# Patient Record
Sex: Female | Born: 2000
Health system: Southern US, Community
[De-identification: ages and names within clinical notes are randomized; demographics above are authoritative.]

## PROBLEM LIST (undated history)

## (undated) DIAGNOSIS — Z87440 Personal history of urinary (tract) infections: Secondary | ICD-10-CM

## (undated) DIAGNOSIS — Z9889 Other specified postprocedural states: Secondary | ICD-10-CM

## (undated) DIAGNOSIS — N3090 Cystitis, unspecified without hematuria: Secondary | ICD-10-CM

## (undated) DIAGNOSIS — F32A Depression, unspecified: Secondary | ICD-10-CM

## (undated) DIAGNOSIS — F419 Anxiety disorder, unspecified: Secondary | ICD-10-CM

## (undated) DIAGNOSIS — E559 Vitamin D deficiency, unspecified: Secondary | ICD-10-CM

## (undated) DIAGNOSIS — F329 Major depressive disorder, single episode, unspecified: Secondary | ICD-10-CM

## (undated) DIAGNOSIS — R112 Nausea with vomiting, unspecified: Secondary | ICD-10-CM

## (undated) DIAGNOSIS — K602 Anal fissure, unspecified: Secondary | ICD-10-CM

## (undated) HISTORY — DX: Anxiety disorder, unspecified: F41.9

## (undated) HISTORY — DX: Vitamin D deficiency, unspecified: E55.9

## (undated) HISTORY — PX: COLONOSCOPY: SHX174

## (undated) HISTORY — DX: Depression, unspecified: F32.A

## (undated) HISTORY — DX: Anal fissure, unspecified: K60.2

## (undated) HISTORY — PX: WISDOM TOOTH EXTRACTION: SHX21

## (undated) HISTORY — DX: Personal history of urinary (tract) infections: Z87.440

---
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
## 2019-01-01 ENCOUNTER — Encounter: Payer: Self-pay | Admitting: Physician Assistant

## 2019-01-11 ENCOUNTER — Ambulatory Visit (INDEPENDENT_AMBULATORY_CARE_PROVIDER_SITE_OTHER): Payer: 59 | Admitting: Physician Assistant

## 2019-01-11 ENCOUNTER — Encounter: Payer: Self-pay | Admitting: Physician Assistant

## 2019-01-11 VITALS — BP 92/62 | HR 90 | Ht 63.0 in | Wt 147.0 lb

## 2019-01-11 DIAGNOSIS — K625 Hemorrhage of anus and rectum: Secondary | ICD-10-CM | POA: Diagnosis not present

## 2019-01-11 DIAGNOSIS — K602 Anal fissure, unspecified: Secondary | ICD-10-CM

## 2019-01-11 MED ORDER — AMBULATORY NON FORMULARY MEDICATION: 1 refills | Status: DC

## 2019-01-11 MED ORDER — LIDOCAINE HCL 2 % EX CREA
TOPICAL_CREAM | CUTANEOUS | 1 refills | Status: DC
Start: 2019-01-11 — End: 2019-07-14

## 2019-01-11 NOTE — Patient Instructions (Signed)
We have sent a prescription for nitroglycerin 0.125% gel to [facility]. You should apply a pea size amount to your rectum three times daily x 6-8 weeks.  [facility]'s information is below: Address: [address] [place], [place] [address]  [phone]  *Please DO NOT go directly from our office to pick up this medication! Give the pharmacy 1 day to process the prescription as this is compounded at takes time to make.  Use a stool softener daily 1-2 tablets of colace or dulcolax Keep your stools regular and soft. Call if no relief in 6-8 weeks.   Do sitz baths 2-3 times a day  How to Take a [employer] A sitz bath is a warm water bath that may be used to care for your rectum, genital area, or the area between your rectum and genitals (perineum). For a sitz bath, the water only comes up to your hips and covers your buttocks. A sitz bath may done at home in a bathtub or with a portable sitz bath that fits over the toilet. Your health care provider may recommend a sitz bath to help:  Relieve pain and discomfort after delivering a baby.  Relieve pain and itching from hemorrhoids or anal fissures.  Relieve pain after certain surgeries.  Relax muscles that are sore or tight. How to take a sitz bath Take 3-4 sitz baths a day, or as many as told by your health care provider. Bathtub sitz bath To take a sitz bath in a bathtub: 1. Partially fill a bathtub with warm water. The water should be deep enough to cover your hips and buttocks when you are sitting in the tub. 2. If your health care provider told you to put medicine in the water, follow his or her instructions. 3. Sit in the water. 4. Open the tub drain a little, and leave it open during your bath. 5. Turn on the warm water again, enough to replace the water that is draining out. Keep the water running throughout your bath. This helps keep the water at the right level and the right temperature. 6. Soak in the  water for 15-20 minutes, or as long as told by your health care provider. 7. When you are done, be careful when you stand up. You may feel dizzy. 8. After the sitz bath, pat yourself dry. Do not rub your skin to dry it.  Over-the-toilet sitz bath To take a sitz bath with an over-the-toilet basin: 1. Follow the manufacturer's instructions. 2. Fill the basin with warm water. 3. If your health care provider told you to put medicine in the water, follow his or her instructions. 4. Sit on the seat. Make sure the water covers your buttocks and perineum. 5. Soak in the water for 15-20 minutes, or as long as told by your health care provider. 6. After the sitz bath, pat yourself dry. Do not rub your skin to dry it. 7. Clean and dry the basin between uses. 8. Discard the basin if it cracks, or according to the manufacturer's instructions. Contact a health care provider if:  Your symptoms get worse. Do not continue with sitz baths if your symptoms get worse.  You have new symptoms. If this happens, do not continue with sitz baths until you talk with your health care provider. Summary  A sitz bath is a warm water bath in which the water only comes up to your hips and covers your buttocks.  A sitz bath may help  relieve itching, relieve pain, and relax muscles that are sore or tight in the lower part of your body, including your genital area.  Take 3-4 sitz baths a day, or as many as told by your health care provider. Soak in the water for 15-20 minutes.  Do not continue with sitz baths if your symptoms get worse. This information is not intended to replace advice given to you by your health care provider. Make sure you discuss any questions you have with your health care provider. Document Released: 01/09/2004 Document Revised: 04/20/2017 Document Reviewed: 04/20/2017 Elsevier Patient Education  2020 [employer].

## 2019-01-11 NOTE — Progress Notes (Signed)
Chief Complaint: Rectal bleeding  HPI:    [name] is a 18 year old Caucasian female with a past medical history as listed below including anxiety, who was referred to me by Sharion Balloon, FNP for a complaint of rectal bleeding.     Today, the patient explains that about 2 months ago she got severely constipated while at college and would not have a bowel movement but every 3 to 4 days and it was very hard and she had to strain for the stools.  She started noticing some streaking blood in her stool and that she had a sharp pain with passing a bowel movement.  This pain continued with bowel movements and the bleeding has gotten slightly more severe.  Over the past 3 to 4 weeks she has been able to maintain daily if not twice a day stools but they are still sometimes hard with pain and blood.  She tried using a stool softener which seemed to help but she continued with the pain so she discontinued this.    Denies fever, chills, weight loss, family history of colon cancer or IBD.     Past Medical History:  Diagnosis Date  . Cystitis     No past surgical history on file.  Current Outpatient Medications  Medication Sig Dispense Refill  . ALPRAZolam (XANAX) 0.5 MG tablet Take 1 tablet (0.5 mg total) by mouth 3 (three) times daily as needed for anxiety (Panic). 90 tablet 2  . escitalopram (LEXAPRO) 20 MG tablet Take 1 tablet (20 mg total) by mouth at bedtime. 90 tablet 1  . medroxyPROGESTERone (DEPO-PROVERA) 150 MG/ML injection Depo-Provera 150 mg/mL intramuscular suspension  Inject 1 mL every 3 months by intramuscular route.     No current facility-administered medications for this visit.     Allergies as of 01/11/2019 - Review Complete 12/18/2018  Allergen Reaction Noted  . Shellfish allergy  05/08/2018   Family History: No significant family hx  Social History   Socioeconomic History  . Marital status: Single    Spouse name: Not on file  . Number of children: Not on file  .  Years of education: Not on file  . Highest education level: Not on file  Occupational History  . Not on file  Social Needs  . Financial resource strain: Not on file  . Food insecurity    Worry: Not on file    Inability: Not on file  . Transportation needs    Medical: Not on file    Non-medical: Not on file  Tobacco Use  . Smoking status: Never Smoker  . Smokeless tobacco: Never Used  Substance and Sexual Activity  . Alcohol use: No  . Drug use: No  . Sexual activity: Never    Birth control/protection: None  Lifestyle  . Physical activity    Days per week: Not on file    Minutes per session: Not on file  . Stress: Not on file  Relationships  . Social Herbalist on phone: Not on file    Gets together: Not on file    Attends religious service: Not on file    Active member of club or organization: Not on file    Attends meetings of clubs or organizations: Not on file    Relationship status: Not on file  . Intimate partner violence    Fear of current or ex partner: Not on file    Emotionally abused: Not on file    Physically abused:  Not on file    Forced sexual activity: Not on file  Other Topics Concern  . Not on file  Social History Narrative  . Not on file    Review of Systems:    Constitutional: No weight loss, fever or chills Skin: No rash  Cardiovascular: No chest pain  Respiratory: No SOB Gastrointestinal: See HPI and otherwise negative Genitourinary: No dysuria  Neurological: No headache, dizziness or syncope Musculoskeletal: No new muscle or joint pain Hematologic: No bruising Psychiatric: +anxiety   Physical Exam:  Vital signs: BP 92/62   Pulse 90   Ht 5\' 3"  (1.6 m)   Wt 147 lb (66.7 kg)   BMI 26.04 kg/m   Constitutional:   Pleasant Caucasian female appears to be in NAD, Well developed, Well nourished, alert and cooperative Head:  Normocephalic and atraumatic. Eyes:   PEERL, EOMI. No icterus. Conjunctiva pink. Ears:  Normal auditory  acuity. Neck:  Supple Throat: Oral cavity and pharynx without inflammation, swelling or lesion.  Respiratory: Respirations even and unlabored. Lungs clear to auscultation bilaterally.   No wheezes, crackles, or rhonchi.  Cardiovascular: Normal S1, S2. No MRG. Regular rate and rhythm. No peripheral edema, cyanosis or pallor.  Gastrointestinal:  Soft, nondistended, nontender. No rebound or guarding. Normal bowel sounds. No appreciable masses or hepatomegaly. Rectal:  External: Posterior fissure, ttp; Internal exam: deferred due to pain  Msk:  Symmetrical without gross deformities. Without edema, no deformity or joint abnormality.  Neurologic:  Alert and  oriented x4;  grossly normal neurologically.  Skin:   Dry and intact without significant lesions or rashes. Psychiatric: Demonstrates good judgement and reason without abnormal affect or behaviors.  No recent labs or imaging.  Assessment: 1.  Anal fissure: Posterior and tender at time of exam today, likely source of pain and bleeding from constipation 2.  Rectal bleeding: Due to above  Plan: 1.  Prescribed Nitroglycerin ointment to be applied 3 times daily x6 to 8 weeks. 2.  Also prescribed Lidocaine ointment to be applied as needed for pain. 3.  Discussed sitz bath's 2-3 times a day for 15 to 20 minutes. 4.  Advised the patient to start a daily stool softener, 1-2 tabs of Colace or Dulcolax daily. 5.  Discussed that she should maintain soft solid daily stools and avoid straining and constipation. 6.  Patient to follow in clinic with [place] as needed in the future.  Did discuss that if her pain and bleeding is not completely resolved within 2 months that she needs to call our clinic.  Patient was assigned to Dr. Ardis Hughs today.  Ellouise Newer, PA-C [facility] 01/11/2019, 10:02 AM  Cc: Sharion Balloon, FNP

## 2019-01-11 NOTE — Progress Notes (Signed)
I agree with the above note, plan 

## 2019-03-18 MED FILL — ALPRAZolam 0.5 MG TABS: 0.5 | 30 days supply | Qty: 90 | Fill #1

## 2019-03-20 ENCOUNTER — Other Ambulatory Visit: Payer: Self-pay

## 2019-03-20 ENCOUNTER — Encounter: Payer: Self-pay | Admitting: Physician Assistant

## 2019-03-20 ENCOUNTER — Ambulatory Visit (INDEPENDENT_AMBULATORY_CARE_PROVIDER_SITE_OTHER): Payer: 59 | Admitting: Physician Assistant

## 2019-03-20 VITALS — BP 102/58 | HR 83 | Temp 98.0°F | Ht 63.0 in | Wt 150.3 lb

## 2019-03-20 DIAGNOSIS — K602 Anal fissure, unspecified: Secondary | ICD-10-CM | POA: Diagnosis not present

## 2019-03-20 DIAGNOSIS — K625 Hemorrhage of anus and rectum: Secondary | ICD-10-CM | POA: Diagnosis not present

## 2019-03-20 MED ORDER — DILTIAZEM GEL 2 %: 1.0000 "application " | Freq: Three times a day (TID) | CUTANEOUS | 3 refills | Status: DC

## 2019-03-20 NOTE — Patient Instructions (Addendum)
If you are age 19 or older, your body mass index should be between 23-30. Your Body mass index is 26.62 kg/m. If this is out of the aforementioned range listed, please consider follow up with your Primary Care Provider.  If you are age 51 or younger, your body mass index should be between 19-25. Your Body mass index is 26.62 kg/m. If this is out of the aformentioned range listed, please consider follow up with your Primary Care Provider.   We have sent the following medications to [facility] for you to pick up at your convenience:  1. Diltiazem 2% gel three times daily.   2. Continue Lidocaine as needed.  Follow up in 4-6 weeks.

## 2019-03-20 NOTE — Progress Notes (Signed)
I agree with the above note, plan 

## 2019-03-20 NOTE — Progress Notes (Signed)
Chief Complaint: Follow-up rectal bleeding  HPI:    Krystal Peters is a 18 year old Caucasian female with a past medical history as listed below including anxiety, signed to Dr. Ardis Hughs at last visit, who returns to clinic today for follow-up of her rectal bleeding.     01/11/2019 patient seen in clinic and described being constipated and straining for stools and noticing some streaking in her stool.  And had gotten worse.  Still continues with some constipation at that time.  At time of exam patient was found to have a posterior tender anal fissure and was prescribed nitroglycerin ointment 3 times daily x60 weeks.  Also Lidocaine ointment.  She was to start a stool softener 1-2 tabs of Colace or Dulcolax daily.  It was explained that if her pain and bleeding was not completely resolved within 2 months that she would need to follow in clinic.    Today, the patient presents to clinic and explains that she started out using the Nitroglycerin ointment 3 times daily but had a bad headache and got dizzy from using this so decreased the amount and used it only twice a day, tells me that she has had a decreased amount of bleeding but still has a small amount of bright red blood on the toilet paper when wiping after almost every bowel movement and continues with some lingering rectal pain.  Has also been using lidocaine ointment to help with this.  Tells me that she thinks it has gotten maybe slightly better.  She is able to have regular bowel movements using a stool softener and tells me they are not hard or dry and able to pass with no straining.    Tells me this semester is winding down which is helping with her anxiety level.    Denies fever, chills, weight loss, abdominal pain or symptoms that awaken her from sleep.  Past Medical History:  Diagnosis Date  . Anxiety   . Cystitis   . Depression   . History of UTI     No past surgical history on file.  Current Outpatient Medications  Medication Sig  Dispense Refill  . ALPRAZolam (XANAX) 0.5 MG tablet Take 1 tablet (0.5 mg total) by mouth 3 (three) times daily as needed for anxiety (Panic). 90 tablet 2  . AMBULATORY NON FORMULARY MEDICATION Nitroglycerin ointment 0.125% use 3 times a day for 6-8 weeks, use a pea size amount and insert rectally 1 Tube 1  . escitalopram (LEXAPRO) 20 MG tablet Take 1 tablet (20 mg total) by mouth at bedtime. 90 tablet 1  . Lidocaine HCl 2 % CREA Apply rectally as needed for rectal pain 118 mL 1  . medroxyPROGESTERone (DEPO-PROVERA) 150 MG/ML injection Depo-Provera 150 mg/mL intramuscular suspension  Inject 1 mL every 3 months by intramuscular route.     No current facility-administered medications for this visit.     Allergies as of 03/20/2019 - Review Complete 01/11/2019  Allergen Reaction Noted  . Shellfish allergy  05/08/2018    Family History  Problem Relation Age of Onset  . Thyroid disease Maternal Grandmother   . Heart disease Maternal Grandfather   . Stroke Maternal Grandfather   . Heart disease Paternal Grandmother     Social History   Socioeconomic History  . Marital status: Single    Spouse name: Not on file  . Number of children: Not on file  . Years of education: Not on file  . Highest education level: Not on file  Occupational History  .  Not on file  Social Needs  . Financial resource strain: Not on file  . Food insecurity    Worry: Not on file    Inability: Not on file  . Transportation needs    Medical: Not on file    Non-medical: Not on file  Tobacco Use  . Smoking status: Never Smoker  . Smokeless tobacco: Never Used  Substance and Sexual Activity  . Alcohol use: No  . Drug use: No  . Sexual activity: Never    Birth control/protection: None  Lifestyle  . Physical activity    Days per week: Not on file    Minutes per session: Not on file  . Stress: Not on file  Relationships  . Social Herbalist on phone: Not on file    Gets together: Not on file     Attends religious service: Not on file    Active member of club or organization: Not on file    Attends meetings of clubs or organizations: Not on file    Relationship status: Not on file  . Intimate partner violence    Fear of current or ex partner: Not on file    Emotionally abused: Not on file    Physically abused: Not on file    Forced sexual activity: Not on file  Other Topics Concern  . Not on file  Social History Narrative  . Not on file    Review of Systems:    Constitutional: No weight loss, fever or chills Cardiovascular: No chest pain  Respiratory: No SOB  Gastrointestinal: See HPI and otherwise negative   Physical Exam:  Vital signs: BP (!) 102/58   Pulse 83   Temp 98 F (36.7 C)   Ht 5\' 3"  (1.6 m)   Wt 150 lb 4.8 oz (68.2 kg)   SpO2 98%   BMI 26.62 kg/m   Constitutional:   Pleasant Caucasian female appears to be in NAD, Well developed, Well nourished, alert and cooperative Respiratory: Respirations even and unlabored. Lungs clear to auscultation bilaterally.   No wheezes, crackles, or rhonchi.  Cardiovascular: Normal S1, S2. No MRG. Regular rate and rhythm. No peripheral edema, cyanosis or pallor.  Gastrointestinal:  Soft, nondistended, nontender. No rebound or guarding. Normal bowel sounds. No appreciable masses or hepatomegaly. Rectal: External: Healing posterior fissure, still very tender to palpation, increased sphincter tone Psychiatric:  Demonstrates good judgement and reason without abnormal affect or behaviors.  No recent labs or imaging.  Assessment: 1.  Anal fissure: This is starting to heal is observed at time of exam today, still very tender for the patient and still with a small amount of bright red blood with bowel movements, patient had only been using nitro twice a day due to some dizziness and headaches  Plan: 1.  Stop Nitroglycerin.  Prescribed Diltiazem 2% ointment to be applied 3 times a day for the next 4 to 6 weeks. 2.  Continue  stool softener to maintain soft easy to pass stools. 3.  Continue lidocaine as needed 4.  Discussed with patient that if this still continues not to heal will need to consider surgical referral.  Hopefully it will get better with a change in ointment. 5.  Patient to follow in clinic in 4 to 6 weeks with me or sooner if necessary.  Ellouise Newer, PA-C [facility] 03/20/2019, 8:39 AM  Cc: Sharion Balloon, FNP

## 2019-04-09 ENCOUNTER — Encounter: Payer: Self-pay | Admitting: Family

## 2019-04-09 ENCOUNTER — Ambulatory Visit (INDEPENDENT_AMBULATORY_CARE_PROVIDER_SITE_OTHER): Payer: 59 | Admitting: Family

## 2019-04-09 DIAGNOSIS — B354 Tinea corporis: Secondary | ICD-10-CM | POA: Diagnosis not present

## 2019-04-09 MED ORDER — KETOCONAZOLE 2 % EX CREA: 1.0000 "application " | TOPICAL_CREAM | Freq: Every day | CUTANEOUS | 1 refills | Status: DC

## 2019-04-09 NOTE — Progress Notes (Signed)
   Virtual Visit via telephone Note Due to COVID-19 pandemic this visit was conducted virtually. This visit type was conducted due to national recommendations for restrictions regarding the COVID-19 Pandemic (e.g. social distancing, sheltering in place) in an effort to limit this patient's exposure and mitigate transmission in our community. All issues noted in this document were discussed and addressed.  A physical exam was not performed with this format.  I connected with Krystal Peters on 04/09/19 at 11:33 AM by telephone and verified that I am speaking with the correct person using two identifiers. Krystal Peters is currently located at home and no one is currently with her during visit. The provider, Evelina Dun, FNP is located in their office at time of visit.  I discussed the limitations, risks, security and privacy concerns of performing an evaluation and management service by telephone and the availability of in person appointments. I also discussed with the patient that there may be a patient responsible charge related to this service. The patient expressed understanding and agreed to proceed.   History and Present Illness:  Patient calls the office today with rash. She reports her roommates boyfriend is wrestler and was diagnosed with ringworm 4 weeks ago and passed it to her roommate who went to her PCPwas given lotriminand her rash improved. The patient reports she noticed the circular rash about two weeks ago. She tried to using the lotrimin with no relief and has spread to her arms, buttocks, and legs.  She reports the area is pruritic.  Rash This is a new problem. The current episode started 1 to 4 weeks ago. The problem has been waxing and waning since onset. The affected locations include the chest.      Review of Systems  Skin: Positive for rash.  All other systems reviewed and are negative.    Observations/Objective: No SOB or distress noted  Assessment and Plan:  1. Tinea corporis Good hand hygiene discussed Do not scratch Wash bedding in hot water Call if area is worsening or not improving  - ketoconazole (NIZORAL) 2 % cream; Apply 1 application topically daily.  Dispense: 60 g; Refill: 1    I discussed the assessment and treatment plan with the patient. The patient was provided an opportunity to ask questions and all were answered. The patient agreed with the plan and demonstrated an understanding of the instructions.   The patient was advised to call back or seek an in-person evaluation if the symptoms worsen or if the condition fails to improve as anticipated.  The above assessment and management plan was discussed with the patient. The patient verbalized understanding of and has agreed to the management plan. Patient is aware to call the clinic if symptoms persist or worsen. Patient is aware when to return to the clinic for a follow-up visit. Patient educated on when it is appropriate to go to the emergency department.   Time call ended:  11:43 AM  I provided 10 minutes of non-face-to-face time during this encounter.    Evelina Dun, FNP

## 2019-04-24 ENCOUNTER — Ambulatory Visit: Payer: 59 | Admitting: Physician Assistant

## 2019-05-16 ENCOUNTER — Ambulatory Visit: Payer: 59 | Admitting: Physician Assistant

## 2019-05-28 MED FILL — ALPRAZolam 0.5 MG TABS: 0.5 | 30 days supply | Qty: 90 | Fill #2

## 2019-06-20 ENCOUNTER — Ambulatory Visit: Payer: 59 | Admitting: Psychiatry

## 2019-06-24 ENCOUNTER — Encounter: Payer: Self-pay | Admitting: Psychiatry

## 2019-06-24 ENCOUNTER — Ambulatory Visit (INDEPENDENT_AMBULATORY_CARE_PROVIDER_SITE_OTHER): Payer: 59 | Admitting: Psychiatry

## 2019-06-24 VITALS — Ht 63.0 in | Wt 150.0 lb

## 2019-06-24 DIAGNOSIS — F325 Major depressive disorder, single episode, in full remission: Secondary | ICD-10-CM | POA: Diagnosis not present

## 2019-06-24 DIAGNOSIS — F410 Panic disorder [episodic paroxysmal anxiety] without agoraphobia: Secondary | ICD-10-CM | POA: Diagnosis not present

## 2019-06-24 DIAGNOSIS — F411 Generalized anxiety disorder: Secondary | ICD-10-CM | POA: Diagnosis not present

## 2019-06-24 MED ORDER — ALPRAZOLAM 0.5 MG PO TABS: 0.5000 mg | ORAL_TABLET | Freq: Three times a day (TID) | ORAL | 2 refills | Status: DC | PRN

## 2019-06-24 MED ORDER — ESCITALOPRAM OXALATE 20 MG PO TABS: 20.0000 mg | ORAL_TABLET | Freq: Every day | ORAL | 1 refills | Status: DC

## 2019-06-24 MED FILL — ESCITALOPRAM 20 MG TABLET: 20 | 90 days supply | Qty: 90 | Fill #0

## 2019-06-24 NOTE — Progress Notes (Signed)
[facility]  Patient ID: Krystal Peters,  MRN: [id number]  PCP: Patient, No Pcp Per  Date of Evaluation: 06/24/2019 Time spent:20 minutes from [id number] to 1600  Chief Complaint:  Chief Complaint    Panic Attack; Anxiety; Depression      HISTORY/CURRENT STATUS: Krystal Peters is provided telemedicine audiovisual appointment session, declining videocamera due to panic and social anxiety canceling her onsite appointment for cough and headache starting new job babysitting and having third molars excised 06/20/2018, phone to phone 20 minutes with consent with epic collateral individually for psychiatric interview and exam in 35-month evaluation and management of panic, generalized anxiety, and major depression.  She estimates today that she is not depressed though she has 1 or 2-day episodes in the interim usually commensurate with situational stressors.  Her panic attacks vary none being severe but requiring Xanax usually a couple per week on the average but lately taking 1 or 2 daily for the last 2 weeks with all of her current stress to try to keep functioning and to decompress at the end of the day.  She completed therapy with Agustina Caroli, PhD and is no longer in therapy.  She feels that she has the skills and understanding to manage her own anxiety now although she is only beginning to attempt employment. She has a new job Garment/textile technologist and has not scheduled any school, assured that she is not returning to [employer].  Anxiety of the last 2 weeks seems to be gradually subsiding.  She did not tolerate the hydrocodone due to spacey discomforts relative to her wisdom teeth extractions and therefore is taking Tylenol and Advil in an alternating fashion.  She does take her Lexapro nightly and concludes the need to continue that currently.  She may skip 1 or 2 days of Lexapro occasionally realizing she needs to restart it from rebound anxiety and despair though not having discontinuation  symptoms or any month long absence from medication. [place] registry documents last Xanax fill was the third of the 3 escriptions from 12/18/2018 last appointment dispensed 05/28/2019.  She has no mania, suicidality, psychosis or delirium.  Anxiety         Presents forfollow-upvisit. Symptoms may includedizziness,dry mouth,excessive worry,hyperventilation,muscle tension,nervous/anxious behavior,prepanic,restlessnessand shortness of breath. Patient reports nodecreased concentration,depressed mood of more than 2 situational days,insomnia, anhedonia,or suicidal ideas. Symptoms occurintermittently. Episodes last30 minutes or less. The severity of symptoms ismuch less interfering with daily activities. The quality of sleep isgood. Nighttime awakenings:occasional. Compliance with medications is76-100%.  Individual Medical History/ Review of Systems: Changes? :Yes Weight is up 4 pounds and she has cough and headache today with excision of third molars 06/20/2018 therefore multiple sources of medical distress.  Allergies: Shellfish allergy  Current Medications:  Current Outpatient Medications:  .  ALPRAZolam (XANAX) 0.5 MG tablet, Take 1 tablet (0.5 mg total) by mouth 3 (three) times daily as needed for anxiety (Panic)., Disp: 90 tablet, Rfl: 2 .  AMBULATORY NON FORMULARY MEDICATION, Nitroglycerin ointment 0.125% use 3 times a day for 6-8 weeks, use a pea size amount and insert rectally, Disp: 1 Tube, Rfl: 1 .  diltiazem 2 % GEL, Apply 1 application topically 3 (three) times daily., Disp: 30 g, Rfl: 3 .  escitalopram (LEXAPRO) 20 MG tablet, Take 1 tablet (20 mg total) by mouth at bedtime., Disp: 90 tablet, Rfl: 1 .  ketoconazole (NIZORAL) 2 % cream, Apply 1 application topically daily., Disp: 60 g, Rfl: 1 .  Lidocaine HCl 2 % CREA, Apply rectally  as needed for rectal pain (Patient not taking: Reported on 03/20/2019), Disp: 118 mL, Rfl: 1 .  medroxyPROGESTERone (DEPO-PROVERA) 150 MG/ML  injection, Depo-Provera 150 mg/mL intramuscular suspension  Inject 1 mL every 3 months by intramuscular route., Disp: , Rfl:   Medication Side Effects: none  Family Medical/ Social History: Changes? No  MENTAL HEALTH EXAM:  Height 5\' 3"  (1.6 m), weight 150 lb (68 kg).Body mass index is 26.57 kg/m. Muscle strengths and tone 5/5, postural reflexes and gait 0/0, and AIMS = 0 otherwise deferred for coronavirus shutdown  General Appearance: N/A  Eye Contact:  N/A  Speech:  Clear and Coherent, Normal Rate and Talkative  Volume:  Normal  Mood:  Anxious, Dysphoric and Euthymic  Affect:  Congruent, Inappropriate, Restricted and Anxious  Thought Process:  Coherent, Goal Directed, Irrelevant and Descriptions of Associations: Tangential  Orientation:  Full (Time, Place, and Person)  Thought Content: Ilusions, Rumination and Tangential   Suicidal Thoughts:  No  Homicidal Thoughts:  No  Memory:  Immediate;   Good Remote;   Good  Judgement:  Fair  Insight:  Fair to limited  Psychomotor Activity:  Normal, Decreased and Mannerisms  Concentration:  Concentration: Fair and Attention Span: Good  Recall:  [employer] of Knowledge: Good  Language: Good  Assets:  Leisure Time Resilience Talents/Skills  ADL's:  Intact  Cognition: WNL  Prognosis:  Good    DIAGNOSES:    ICD-10-CM   1. Panic disorder  F41.0 escitalopram (LEXAPRO) 20 MG tablet    ALPRAZolam (XANAX) 0.5 MG tablet  2. Generalized anxiety disorder  F41.1 escitalopram (LEXAPRO) 20 MG tablet    ALPRAZolam (XANAX) 0.5 MG tablet  3. Major depression, single episode, in complete remission ([place])  F32.5 escitalopram (LEXAPRO) 20 MG tablet    Receiving Psychotherapy: No    RECOMMENDATIONS: Cognitive behavioral therapy interventions for behavioral nutrition, sleep hygiene, social skills, and affirmative task completion are integrated with prevention and monitoring and safety hygiene for symptom treatment matching with medications.  She  continues Lexapro 20 mg every bedtime sent as #90 with 1 refill to [place] for panic, social anxiety and depression.  She continues Xanax 0.5 mg 3 times daily as needed for panic or social anxiety sent as #90 with 2 refills to [place].  She returns for follow-up in 6 months or sooner if needed, being educated on safety and proper use of controlled substance Xanax.  Virtual Visit via Video Note  I connected with Krystal Peters on 06/24/19 at  3:40 PM EST by a video enabled telemedicine application and verified that I am speaking with the correct person using two identifiers.  Location: Patient: Individually at family residence with cough and headache declining video camera for panic and anxiety Provider: [facility] office   I discussed the limitations of evaluation and management by telemedicine and the availability of in person appointments. The patient expressed understanding and agreed to proceed.  History of Present Illness:    Observations/Objective:   Assessment and Plan:   Follow Up Instructions:    I discussed the assessment and treatment plan with the patient. The patient was provided an opportunity to ask questions and all were answered. The patient agreed with the plan and demonstrated an understanding of the instructions.   The patient was advised to call back or seek an in-person evaluation if the symptoms worsen or if the condition fails to improve as anticipated.  I provided 20 minutes of  non-face-to-face time during this encounter. [employer] meeting [id number] Meeting password: [email or web address]  Delight Hoh, MD  Delight Hoh, MD

## 2019-06-25 MED FILL — ALPRAZolam 0.5 MG TABS: 0.5 | 30 days supply | Qty: 90 | Fill #0

## 2019-07-14 ENCOUNTER — Emergency Department (HOSPITAL_COMMUNITY)
Admission: EM | Admit: 2019-07-14 | Discharge: 2019-07-15 | Disposition: A | Discharge status: Home / Self Care | Payer: 59 | Attending: Emergency Medicine | Admitting: Emergency Medicine

## 2019-07-14 ENCOUNTER — Emergency Department (HOSPITAL_COMMUNITY): Payer: 59

## 2019-07-14 ENCOUNTER — Other Ambulatory Visit: Payer: Self-pay

## 2019-07-14 ENCOUNTER — Encounter (HOSPITAL_COMMUNITY): Payer: Self-pay | Admitting: Emergency Medicine

## 2019-07-14 DIAGNOSIS — R197 Diarrhea, unspecified: Secondary | ICD-10-CM | POA: Diagnosis not present

## 2019-07-14 DIAGNOSIS — R1030 Lower abdominal pain, unspecified: Secondary | ICD-10-CM | POA: Insufficient documentation

## 2019-07-14 DIAGNOSIS — R112 Nausea with vomiting, unspecified: Secondary | ICD-10-CM

## 2019-07-14 DIAGNOSIS — R1031 Right lower quadrant pain: Secondary | ICD-10-CM | POA: Diagnosis not present

## 2019-07-14 LAB — URINALYSIS, ROUTINE W REFLEX MICROSCOPIC
Bilirubin Urine: NEGATIVE
Glucose, UA: NEGATIVE mg/dL
Hgb urine dipstick: NEGATIVE
Ketones, ur: 5 mg/dL — AB
Leukocytes,Ua: NEGATIVE
Nitrite: NEGATIVE
Protein, ur: 30 mg/dL — AB
Specific Gravity, Urine: 1.027 (ref 1.005–1.030)
pH: 5 (ref 5.0–8.0)

## 2019-07-14 LAB — COMPREHENSIVE METABOLIC PANEL
ALT: 14 U/L (ref 0–44)
AST: 21 U/L (ref 15–41)
Albumin: 5.4 g/dL — ABNORMAL HIGH (ref 3.5–5.0)
Alkaline Phosphatase: 79 U/L (ref 38–126)
Anion gap: 10 (ref 5–15)
BUN: 15 mg/dL (ref 6–20)
CO2: 22 mmol/L (ref 22–32)
Calcium: 10 mg/dL (ref 8.9–10.3)
Chloride: 105 mmol/L (ref 98–111)
Creatinine, Ser: 0.91 mg/dL (ref 0.44–1.00)
GFR calc Af Amer: >60 mL/min (ref >60)
GFR calc non Af Amer: >60 mL/min (ref >60)
Glucose, Bld: 109 mg/dL — ABNORMAL HIGH (ref 70–99)
Potassium: 3.5 mmol/L (ref 3.5–5.1)
Sodium: 137 mmol/L (ref 135–145)
Total Bilirubin: 0.9 mg/dL (ref 0.3–1.2)
Total Protein: 8.8 g/dL — ABNORMAL HIGH (ref 6.5–8.1)

## 2019-07-14 LAB — CBC
HCT: 49.2 % — ABNORMAL HIGH (ref 36.0–46.0)
Hemoglobin: 16.1 g/dL — ABNORMAL HIGH (ref 12.0–15.0)
MCH: 29.3 pg (ref 26.0–34.0)
MCHC: 32.7 g/dL (ref 30.0–36.0)
MCV: 89.5 fL (ref 80.0–100.0)
Platelets: 490 10*3/uL — ABNORMAL HIGH (ref 150–400)
RBC: 5.5 MIL/uL — ABNORMAL HIGH (ref 3.87–5.11)
RDW: 12 % (ref 11.5–15.5)
WBC: 19.2 10*3/uL — ABNORMAL HIGH (ref 4.0–10.5)
nRBC: 0 % (ref 0.0–0.2)

## 2019-07-14 LAB — HCG, QUANTITATIVE, PREGNANCY: hCG, Beta Chain, Quant, S: <1 m[IU]/mL (ref <5)

## 2019-07-14 LAB — LIPASE, BLOOD: Lipase: 27 U/L (ref 11–51)

## 2019-07-14 MED ORDER — PROMETHAZINE HCL 25 MG/ML IJ SOLN
12.5000 mg | Freq: Once | INTRAMUSCULAR | Status: AC
  Administered 2019-07-15: 12.5 mg via INTRAVENOUS
  Filled 2019-07-14: qty 1

## 2019-07-14 MED ORDER — IOHEXOL 300 MG/ML  SOLN
100.0000 mL | Freq: Once | INTRAMUSCULAR | Status: AC | PRN
  Administered 2019-07-14: 100 mL via INTRAVENOUS

## 2019-07-14 MED ORDER — ONDANSETRON HCL 4 MG/2ML IJ SOLN
4.0000 mg | Freq: Once | INTRAMUSCULAR | Status: AC
  Administered 2019-07-14: 4 mg via INTRAVENOUS

## 2019-07-14 MED ORDER — ONDANSETRON HCL 4 MG/2ML IJ SOLN
4.0000 mg | Freq: Once | INTRAMUSCULAR | Status: AC | PRN
  Administered 2019-07-14: 4 mg via INTRAVENOUS

## 2019-07-14 MED ORDER — SODIUM CHLORIDE 0.9 % IV BOLUS
1000.0000 mL | Freq: Once | INTRAVENOUS | Status: AC
  Administered 2019-07-14: 1000 mL via INTRAVENOUS

## 2019-07-14 MED ORDER — ALPRAZOLAM 0.5 MG PO TABS
0.5000 mg | ORAL_TABLET | Freq: Once | ORAL | Status: AC
  Administered 2019-07-15: 0.5 mg via ORAL
  Filled 2019-07-14: qty 1

## 2019-07-14 MED ORDER — PROMETHAZINE HCL 25 MG/ML IJ SOLN
12.5000 mg | Freq: Once | INTRAMUSCULAR | Status: AC
  Administered 2019-07-14: 12.5 mg via INTRAVENOUS
  Filled 2019-07-14: qty 1

## 2019-07-14 MED ORDER — MORPHINE SULFATE (PF) 2 MG/ML IV SOLN
2.0000 mg | Freq: Once | INTRAVENOUS | Status: AC
  Administered 2019-07-14: 2 mg via INTRAVENOUS
  Filled 2019-07-14: qty 1

## 2019-07-14 NOTE — ED Notes (Signed)
Pt reports she is having a panic attack  Usually takes xanax for same TT informed

## 2019-07-14 NOTE — ED Triage Notes (Signed)
Patient complains of N/V/D that began at noon today. Patient says no other family members are sick and no known covid exposure.

## 2019-07-14 NOTE — ED Notes (Signed)
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

## 2019-07-14 NOTE — ED Notes (Signed)
To CT via stretcher

## 2019-07-15 ENCOUNTER — Emergency Department (HOSPITAL_COMMUNITY): Payer: 59

## 2019-07-15 DIAGNOSIS — R1030 Lower abdominal pain, unspecified: Secondary | ICD-10-CM | POA: Diagnosis not present

## 2019-07-15 DIAGNOSIS — R197 Diarrhea, unspecified: Secondary | ICD-10-CM | POA: Diagnosis not present

## 2019-07-15 LAB — WET PREP, GENITAL
Sperm: NONE SEEN
Trich, Wet Prep: NONE SEEN
Yeast Wet Prep HPF POC: NONE SEEN

## 2019-07-15 MED ORDER — ONDANSETRON HCL 4 MG PO TABS: 4.0000 mg | ORAL_TABLET | Freq: Three times a day (TID) | ORAL | 0 refills | Status: DC | PRN

## 2019-07-15 NOTE — Discharge Instructions (Addendum)
Follow-up with your doctor for recheck, return here if needed

## 2019-07-15 NOTE — ED Provider Notes (Signed)
[facility] Provider Note   CSN: [id number] Arrival date & time: 07/14/19  1913     History Chief Complaint  Patient presents with  . Emesis    Krystal Peters is a 19 y.o. female.  HPI      Krystal Peters is a 19 y.o. female with past medical history of anxiety who presents to the Emergency Department complaining of sudden onset of nausea, vomiting, and diarrhea beginning this afternoon.  She reports multiple episodes of vomiting and watery stool since onset and she describes a brief syncopal event after defecating.  She states she is now having just liquid stools and mostly clear vomitus.  No alleviating factors.  She denies cough, fever, loss of taste or smell, and known Covid exposures.  She also denies any new foods or medications.    Past Medical History:  Diagnosis Date  . Anxiety   . Cystitis   . Depression   . History of UTI     Patient Active Problem List   Diagnosis Date Noted  . Panic disorder 05/08/2018  . Generalized anxiety disorder 05/08/2018  . Major depression, single episode, in complete remission ([place]) 05/08/2018  . Acute cystitis with hematuria 10/04/2016    History reviewed. No pertinent surgical history.   OB History   No obstetric history on file.     Family History  Problem Relation Age of Onset  . Thyroid disease Maternal Grandmother   . Heart disease Maternal Grandfather   . Stroke Maternal Grandfather   . Heart disease Paternal Grandmother     Social History   Tobacco Use  . Smoking status: Never Smoker  . Smokeless tobacco: Never Used  Substance Use Topics  . Alcohol use: No  . Drug use: No    Home Medications Prior to Admission medications   Medication Sig Start Date End Date Taking? Authorizing Provider  ALPRAZolam Duanne Moron) 0.5 MG tablet Take 1 tablet (0.5 mg total) by mouth 3 (three) times daily as needed for anxiety (Panic). 06/24/19  Yes Delight Hoh, MD  AMBULATORY NON FORMULARY  MEDICATION Nitroglycerin ointment 0.125% use 3 times a day for 6-8 weeks, use a pea size amount and insert rectally Patient taking differently: Place 1 application rectally See admin instructions. Nitroglycerin ointment 0.125% use 3 times a day for 6-8 weeks, use a pea size amount and insert rectally 01/11/19  Yes Lemmon, Lavone Nian, PA  escitalopram (LEXAPRO) 20 MG tablet Take 1 tablet (20 mg total) by mouth at bedtime. 06/24/19  Yes Delight Hoh, MD  medroxyPROGESTERone (DEPO-PROVERA) 150 MG/ML injection Inject 150 mg into the muscle every 3 (three) months.    Yes [provider]    Allergies    Shellfish allergy  Review of Systems   Review of Systems  Constitutional: Negative for appetite change, chills and fever.  Respiratory: Negative for shortness of breath.   Cardiovascular: Negative for chest pain.  Gastrointestinal: Positive for abdominal pain, diarrhea, nausea and vomiting. Negative for blood in stool.  Genitourinary: Negative for decreased urine volume, difficulty urinating, dysuria, flank pain, vaginal bleeding and vaginal discharge.  Musculoskeletal: Negative for back pain.  Skin: Negative for color change and rash.  Neurological: Negative for dizziness, weakness and numbness.  Hematological: Negative for adenopathy.    Physical Exam Updated Vital Signs BP 106/68   Pulse 95   Temp 97.9 F (36.6 C) (Oral)   Resp 16   Ht 5\' 3"  (1.6 m)   Wt 73.5 kg  SpO2 100%   BMI 28.70 kg/m   Physical Exam Vitals and nursing note reviewed. Exam conducted with a chaperone present.  Constitutional:      General: She is not in acute distress.    Appearance: She is well-developed. She is not ill-appearing or toxic-appearing.     Comments: Pt is anxious appearing and tearful  HENT:     Head: Normocephalic and atraumatic.     Mouth/Throat:     Mouth: Mucous membranes are moist.  Cardiovascular:     Rate and Rhythm: Normal rate and regular rhythm.     Pulses:  Normal pulses.  Pulmonary:     Effort: Pulmonary effort is normal. No respiratory distress.     Breath sounds: Normal breath sounds.  Abdominal:     General: There is no distension.     Palpations: Abdomen is soft. There is no mass.     Tenderness: There is abdominal tenderness. There is no guarding or rebound. Positive signs include McBurney's sign.     Comments: Patient with right lower quadrant tenderness on exam.  No guarding.  Abdomen is soft  Genitourinary:    Vagina: No vaginal discharge.     Cervix: No cervical motion tenderness.     Adnexa:        Right: Tenderness present. No mass.         Left: No mass or tenderness.       Comments: Slight yellow-colored smegma present at the labial folds.  Tenderness of the right adnexa.  No palpable mass.  No cervical motion tenderness or significant vaginal discharge. Musculoskeletal:        General: Normal range of motion.  Skin:    General: Skin is warm and dry.     Capillary Refill: Capillary refill takes less than 2 seconds.  Neurological:     Mental Status: She is alert and oriented to person, place, and time.     Sensory: Sensation is intact. No sensory deficit.     Motor: Motor function is intact. No weakness or abnormal muscle tone.     Coordination: Coordination normal.     Comments: CN II-XII intact.  Speech clear     ED Results / Procedures / Treatments   Labs (all labs ordered are listed, but only abnormal results are displayed) Labs Reviewed  WET PREP, GENITAL - Abnormal; Notable for the following components:      Result Value   Clue Cells Wet Prep HPF POC PRESENT (*)    WBC, Wet Prep HPF POC MANY (*)    All other components within normal limits  COMPREHENSIVE METABOLIC PANEL - Abnormal; Notable for the following components:   Glucose, Bld 109 (*)    Total Protein 8.8 (*)    Albumin 5.4 (*)    All other components within normal limits  CBC - Abnormal; Notable for the following components:   WBC 19.2 (*)    RBC  5.50 (*)    Hemoglobin 16.1 (*)    HCT 49.2 (*)    Platelets 490 (*)    All other components within normal limits  URINALYSIS, ROUTINE W REFLEX MICROSCOPIC - Abnormal; Notable for the following components:   APPearance CLOUDY (*)    Ketones, ur 5 (*)    Protein, ur 30 (*)    Bacteria, UA RARE (*)    All other components within normal limits  URINE CULTURE  LIPASE, BLOOD  HCG, QUANTITATIVE, PREGNANCY  GC/CHLAMYDIA PROBE AMP ([place]) NOT AT [facility]  EKG None  Radiology CT ABDOMEN PELVIS W CONTRAST  Result Date: 07/14/2019 CLINICAL DATA:  Nausea, vomiting and diarrhea. EXAM: CT ABDOMEN AND PELVIS WITH CONTRAST TECHNIQUE: Multidetector CT imaging of the abdomen and pelvis was performed using the standard protocol following bolus administration of intravenous contrast. CONTRAST:  126mL OMNIPAQUE IOHEXOL 300 MG/ML  SOLN COMPARISON:  June 14, 2017 FINDINGS: Lower chest: No acute abnormality. Hepatobiliary: No focal liver abnormality is seen. No gallstones, gallbladder wall thickening, or biliary dilatation. Pancreas: Unremarkable. No pancreatic ductal dilatation or surrounding inflammatory changes. Spleen: Normal in size without focal abnormality. Adrenals/Urinary Tract: Adrenal glands are unremarkable. Kidneys are normal, without renal calculi, focal lesion, or hydronephrosis. The urinary bladder is empty and subsequently limited in evaluation. Stomach/Bowel: Stomach is within normal limits. Appendix appears normal. No evidence of bowel wall thickening, distention, or inflammatory changes. Vascular/Lymphatic: No significant vascular findings are present. No enlarged abdominal or pelvic lymph nodes. Reproductive: Uterus and bilateral adnexa are unremarkable. Other: No abdominal wall hernia or abnormality. No abdominopelvic ascites. Musculoskeletal: No acute or significant osseous findings. IMPRESSION: No acute intra-abdominal findings. Electronically Signed   By: Virgina Norfolk M.D.   On:  07/14/2019 22:03    Procedures Procedures (including critical care time)  Medications Ordered in ED Medications  ondansetron (ZOFRAN) injection 4 mg (4 mg Intravenous Given 07/14/19 2008)  ondansetron (ZOFRAN) injection 4 mg (4 mg Intravenous Given 07/14/19 2022)  sodium chloride 0.9 % bolus 1,000 mL (0 mLs Intravenous Stopped 07/14/19 2325)  morphine 2 MG/ML injection 2 mg (2 mg Intravenous Given 07/14/19 2033)  iohexol (OMNIPAQUE) 300 MG/ML solution 100 mL (100 mLs Intravenous Contrast Given 07/14/19 2123)  promethazine (PHENERGAN) injection 12.5 mg (12.5 mg Intravenous Given 07/14/19 2200)  ALPRAZolam (XANAX) tablet 0.5 mg (0.5 mg Oral Given 07/15/19 0033)  promethazine (PHENERGAN) injection 12.5 mg (12.5 mg Intravenous Given 07/15/19 0034)    ED Course  I have reviewed the triage vital signs and the nursing notes.  Pertinent labs & imaging results that were available during my care of the patient were reviewed by me and considered in my medical decision making (see chart for details).    MDM Rules/Calculators/A&P                     Pt is tearful and anxious appearing with right lower abdominal pain, vomiting and diarrhea.  No known Covid exposures.  No respiratory symptoms.  Exam is concerning for appendicitis.  Will obtain labs and CT of the abdomen and pelvis.  U/A shows some WBC's and rare bacteria, likely contaminant.  Urine culture pending.    CT scan is negative for acute appendicitis.  Patient initially refusing pelvic exam, but later agrees.  She does have tenderness at the right adnexa.  Concerning for torsion.  Will obtain ultrasound. Pt also refusing transvaginal unless given additional anti-emetic and she is requesting medication for her anxiety stating that she is due for her evening Xanax dose  Pt also seen by Dr. Sedonia Small  [place] results pending at end of shift.  Pt will likely be discharged home.  Dr. Betsey Holiday to view results and arrange disposition   Final Clinical  Impression(s) / ED Diagnoses Final diagnoses:  Lower abdominal pain    Rx / DC Orders ED Discharge Orders    None       Kem Parkinson, PA-C 07/15/19 0151    Maudie Flakes, MD 07/17/19 2350

## 2019-07-16 LAB — GC/CHLAMYDIA PROBE AMP (~~LOC~~) NOT AT ARMC
Chlamydia: NEGATIVE
Neisseria Gonorrhea: NEGATIVE

## 2019-07-16 LAB — URINE CULTURE

## 2019-08-15 MED FILL — ESCITALOPRAM 20 MG TABLET: 20 | 90 days supply | Qty: 90 | Fill #1

## 2019-08-23 MED FILL — ALPRAZolam 0.5 MG TABS: 0.5 | 30 days supply | Qty: 90 | Fill #0

## 2019-09-17 DIAGNOSIS — Z6829 Body mass index (BMI) 29.0-29.9, adult: Secondary | ICD-10-CM | POA: Diagnosis not present

## 2019-09-17 DIAGNOSIS — Z01419 Encounter for gynecological examination (general) (routine) without abnormal findings: Secondary | ICD-10-CM | POA: Diagnosis not present

## 2019-09-17 DIAGNOSIS — Z113 Encounter for screening for infections with a predominantly sexual mode of transmission: Secondary | ICD-10-CM | POA: Diagnosis not present

## 2019-09-17 DIAGNOSIS — Z3202 Encounter for pregnancy test, result negative: Secondary | ICD-10-CM | POA: Diagnosis not present

## 2019-09-17 DIAGNOSIS — N760 Acute vaginitis: Secondary | ICD-10-CM | POA: Diagnosis not present

## 2019-09-17 DIAGNOSIS — N912 Amenorrhea, unspecified: Secondary | ICD-10-CM | POA: Diagnosis not present

## 2019-11-25 MED FILL — ALPRAZolam 0.5 MG TABS: 0.5 | 30 days supply | Qty: 90 | Fill #1

## 2020-02-19 ENCOUNTER — Encounter: Payer: Self-pay | Admitting: Psychiatry

## 2020-02-27 ENCOUNTER — Encounter: Payer: Self-pay | Admitting: Adult Health

## 2020-02-27 ENCOUNTER — Ambulatory Visit (INDEPENDENT_AMBULATORY_CARE_PROVIDER_SITE_OTHER): Payer: 59 | Admitting: Adult Health

## 2020-02-27 ENCOUNTER — Other Ambulatory Visit: Payer: Self-pay | Admitting: Adult Health

## 2020-02-27 ENCOUNTER — Other Ambulatory Visit: Payer: Self-pay

## 2020-02-27 DIAGNOSIS — F411 Generalized anxiety disorder: Secondary | ICD-10-CM | POA: Diagnosis not present

## 2020-02-27 DIAGNOSIS — F325 Major depressive disorder, single episode, in full remission: Secondary | ICD-10-CM

## 2020-02-27 DIAGNOSIS — F410 Panic disorder [episodic paroxysmal anxiety] without agoraphobia: Secondary | ICD-10-CM | POA: Diagnosis not present

## 2020-02-27 MED ORDER — ESCITALOPRAM OXALATE 10 MG PO TABS: 10.0000 mg | ORAL_TABLET | Freq: Every day | ORAL | 1 refills | Status: DC

## 2020-02-27 MED ORDER — ALPRAZOLAM 0.5 MG PO TABS: 0.5000 mg | ORAL_TABLET | Freq: Three times a day (TID) | ORAL | 2 refills | Status: DC | PRN

## 2020-02-27 MED FILL — ALPRAZolam 0.5 MG TABS: 0.5 | 30 days supply | Qty: 90 | Fill #0

## 2020-02-27 MED FILL — ESCITALOPRAM 10 MG TABLET: 10 | 90 days supply | Qty: 90 | Fill #0

## 2020-02-27 NOTE — Progress Notes (Signed)
Krystal Peters [id number] 10/18/2000 19 y.o.  Subjective:   Patient ID:  Krystal Peters is a 19 y.o. (DOB [birth year]) female.  Chief Complaint: No chief complaint on file.   HPI Krystal Peters presents to the office today for follow-up of anxiety and panic attacks.  Describes mood today as "ok". Pleasant. Mood symptoms - reports depression, anxiety, and irritability at times. Restarted Lexapro 10mg  - 3 weeks ago and is doing "better". Had tapered off of Lexapro 8 months ago and started having mood instability and decided to restart the medication. Using Xanax as needed for panic attacks. Stable interest and motivation. Taking medications as prescribed.  Energy levels stable. Active, does not have a regular exercise routine. Walking and running on tread mill. Enjoys some usual interests and activities. Single. Student. Lives with 2 room-mates. Mother in [place]. Father in [place]. Spending time with family. Appetite adequate. Weight gain 32 pounds since starting Depo.this year.  Sleeps well most nights. Averages 9 hours. Focus and concentration stable. Completing tasks. Managing aspects of household. Full time student. 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  Denies AH or VH.  Previous medication trials: Eulis Foster   PHQ2-9     Office Visit from 01/10/2017 in [place] Visit from 10/04/2016 in [place] Visit from 09/15/2016 in [place] Visit from 06/07/2016 in [place] Visit from 12/30/2015 in [place]  PHQ-2 Total Score 0 0 0 0 0  PHQ-9 Total Score -- -- -- 0 0       Review of Systems:  Review of Systems  Musculoskeletal: Negative for gait problem.  Neurological: Negative for tremors.  Psychiatric/Behavioral:       Please refer to HPI    Medications: I have reviewed the patient's current medications.  Current Outpatient Medications   Medication Sig Dispense Refill  . ALPRAZolam (XANAX) 0.5 MG tablet Take 1 tablet (0.5 mg total) by mouth 3 (three) times daily as needed for anxiety (Panic). 90 tablet 2  . AMBULATORY NON FORMULARY MEDICATION Nitroglycerin ointment 0.125% use 3 times a day for 6-8 weeks, use a pea size amount and insert rectally (Patient taking differently: Place 1 application rectally See admin instructions. Nitroglycerin ointment 0.125% use 3 times a day for 6-8 weeks, use a pea size amount and insert rectally) 1 Tube 1  . escitalopram (LEXAPRO) 10 MG tablet Take 1 tablet (10 mg total) by mouth at bedtime. 90 tablet 1  . medroxyPROGESTERone (DEPO-PROVERA) 150 MG/ML injection Inject 150 mg into the muscle every 3 (three) months.     . ondansetron (ZOFRAN) 4 MG tablet Take 1 tablet (4 mg total) by mouth every 8 (eight) hours as needed for nausea or vomiting. 4 tablet 0   No current facility-administered medications for this visit.    Medication Side Effects: None  Allergies:  Allergies  Allergen Reactions  . Shellfish Allergy     Unknown-not specifically determined    Past Medical History:  Diagnosis Date  . Anxiety   . Cystitis   . Depression   . History of UTI     Family History  Problem Relation Age of Onset  . Thyroid disease Maternal Grandmother   . Heart disease Maternal Grandfather   . Stroke Maternal Grandfather   . Heart disease Paternal Grandmother     Social History   Socioeconomic History  . Marital status: Single    Spouse name: Not on file  .  Number of children: Not on file  . Years of education: Not on file  . Highest education level: Not on file  Occupational History  . Not on file  Tobacco Use  . Smoking status: Never Smoker  . Smokeless tobacco: Never Used  Vaping Use  . Vaping Use: Never used  Substance and Sexual Activity  . Alcohol use: No  . Drug use: No  . Sexual activity: Never    Birth control/protection: None  Other Topics Concern  . Not on file   Social History Narrative  . Not on file   Social Determinants of Health   Financial Resource Strain:   . Difficulty of Paying Living Expenses: Not on file  Food Insecurity:   . Worried About Charity fundraiser in the Last Year: Not on file  . Ran Out of Food in the Last Year: Not on file  Transportation Needs:   . Lack of Transportation (Medical): Not on file  . Lack of Transportation (Non-Medical): Not on file  Physical Activity:   . Days of Exercise per Week: Not on file  . Minutes of Exercise per Session: Not on file  Stress:   . Feeling of Stress : Not on file  Social Connections:   . Frequency of Communication with Friends and Family: Not on file  . Frequency of Social Gatherings with Friends and Family: Not on file  . Attends Religious Services: Not on file  . Active Member of Clubs or Organizations: Not on file  . Attends Archivist Meetings: Not on file  . Marital Status: Not on file  Intimate Partner Violence:   . Fear of Current or Ex-Partner: Not on file  . Emotionally Abused: Not on file  . Physically Abused: Not on file  . Sexually Abused: Not on file    Past Medical History, Surgical history, Social history, and Family history were reviewed and updated as appropriate.   Please see review of systems for further details on the patient's review from today.   Objective:   Physical Exam:  There were no vitals taken for this visit.  Physical Exam Constitutional:      General: She is not in acute distress. Musculoskeletal:        General: No deformity.  Neurological:     Mental Status: She is alert and oriented to person, place, and time.     Coordination: Coordination normal.  Psychiatric:        Attention and Perception: Attention and perception normal. She does not perceive auditory or visual hallucinations.        Mood and Affect: Mood normal. Mood is not anxious or depressed. Affect is not labile, blunt, angry or inappropriate.         Speech: Speech normal.        Behavior: Behavior normal.        Thought Content: Thought content normal. Thought content is not paranoid or delusional. Thought content does not include homicidal or suicidal ideation. Thought content does not include homicidal or suicidal plan.        Cognition and Memory: Cognition and memory normal.        Judgment: Judgment normal.     Comments: Insight intact     Lab Review:     Component Value Date/Time   NA 137 07/14/2019 1920   NA 140 01/10/2017 1819   K 3.5 07/14/2019 1920   CL 105 07/14/2019 1920   CO2 22 07/14/2019 1920   GLUCOSE  109 (H) 07/14/2019 1920   BUN 15 07/14/2019 1920   BUN 5 01/10/2017 1819   CREATININE 0.91 07/14/2019 1920   CALCIUM 10.0 07/14/2019 1920   PROT 8.8 (H) 07/14/2019 1920   PROT 7.0 01/10/2017 1819   ALBUMIN 5.4 (H) 07/14/2019 1920   ALBUMIN 4.3 01/10/2017 1819   AST 21 07/14/2019 1920   ALT 14 07/14/2019 1920   ALKPHOS 79 07/14/2019 1920   BILITOT 0.9 07/14/2019 1920   BILITOT <0.2 01/10/2017 1819   GFRNONAA >60 07/14/2019 1920   GFRAA >60 07/14/2019 1920       Component Value Date/Time   WBC 19.2 (H) 07/14/2019 1920   RBC 5.50 (H) 07/14/2019 1920   HGB 16.1 (H) 07/14/2019 1920   HGB 12.9 01/10/2017 1819   HCT 49.2 (H) 07/14/2019 1920   HCT 36.9 01/10/2017 1819   PLT 490 (H) 07/14/2019 1920   PLT 161 01/10/2017 1819   MCV 89.5 07/14/2019 1920   MCV 84 01/10/2017 1819   MCH 29.3 07/14/2019 1920   MCHC 32.7 07/14/2019 1920   RDW 12.0 07/14/2019 1920   RDW 13.7 01/10/2017 1819   LYMPHSABS 1.2 01/10/2017 1819   EOSABS 0.1 01/10/2017 1819   BASOSABS 0.0 01/10/2017 1819    No results found for: POCLITH, LITHIUM   No results found for: PHENYTOIN, PHENOBARB, VALPROATE, CBMZ   .res Assessment: Plan:    Plan:  1. Lexapro 10mg  daily 2. Xanax 0.5mg  TID prn  Read and reviewed note with patient for accuracy.   RTC 6 months   Patient advised to contact office with any questions, adverse  effects, or acute worsening in signs and symptoms.  Discussed potential benefits, risk, and side effects of benzodiazepines to include potential risk of tolerance and dependence, as well as possible drowsiness.  Advised patient not to drive if experiencing drowsiness and to take lowest possible effective dose to minimize risk of dependence and tolerance.  Diagnoses and all orders for this visit:  Panic disorder -     escitalopram (LEXAPRO) 10 MG tablet; Take 1 tablet (10 mg total) by mouth at bedtime. -     ALPRAZolam (XANAX) 0.5 MG tablet; Take 1 tablet (0.5 mg total) by mouth 3 (three) times daily as needed for anxiety (Panic).  Generalized anxiety disorder -     escitalopram (LEXAPRO) 10 MG tablet; Take 1 tablet (10 mg total) by mouth at bedtime. -     ALPRAZolam (XANAX) 0.5 MG tablet; Take 1 tablet (0.5 mg total) by mouth 3 (three) times daily as needed for anxiety (Panic).  Major depression, single episode, in complete remission (HCC) -     escitalopram (LEXAPRO) 10 MG tablet; Take 1 tablet (10 mg total) by mouth at bedtime.     Please see After Visit Summary for patient specific instructions.  No future appointments.  No orders of the defined types were placed in this encounter.   -------------------------------

## 2020-03-16 DIAGNOSIS — R293 Abnormal posture: Secondary | ICD-10-CM | POA: Diagnosis not present

## 2020-03-16 DIAGNOSIS — N62 Hypertrophy of breast: Secondary | ICD-10-CM | POA: Diagnosis not present

## 2020-04-13 DIAGNOSIS — N62 Hypertrophy of breast: Secondary | ICD-10-CM | POA: Diagnosis not present

## 2020-05-13 MED FILL — ALPRAZolam 0.5 MG TABS: 0.5 | 30 days supply | Qty: 90 | Fill #1

## 2020-05-21 DIAGNOSIS — N760 Acute vaginitis: Secondary | ICD-10-CM | POA: Diagnosis not present

## 2020-05-21 DIAGNOSIS — Z113 Encounter for screening for infections with a predominantly sexual mode of transmission: Secondary | ICD-10-CM | POA: Diagnosis not present

## 2020-06-20 DIAGNOSIS — Z20822 Contact with and (suspected) exposure to covid-19: Secondary | ICD-10-CM | POA: Diagnosis not present

## 2020-07-24 MED FILL — ALPRAZolam 0.5 MG TABS: 0.5 | 30 days supply | Qty: 90 | Fill #2

## 2020-08-04 ENCOUNTER — Other Ambulatory Visit: Payer: Self-pay

## 2020-08-04 ENCOUNTER — Encounter (HOSPITAL_BASED_OUTPATIENT_CLINIC_OR_DEPARTMENT_OTHER): Payer: Self-pay | Admitting: Plastic Surgery

## 2020-08-05 ENCOUNTER — Ambulatory Visit: Payer: Self-pay | Admitting: Plastic Surgery

## 2020-08-05 DIAGNOSIS — N62 Hypertrophy of breast: Secondary | ICD-10-CM | POA: Diagnosis not present

## 2020-08-06 ENCOUNTER — Ambulatory Visit: Payer: 59 | Admitting: Adult Health

## 2020-08-06 DIAGNOSIS — R5383 Other fatigue: Secondary | ICD-10-CM | POA: Diagnosis not present

## 2020-08-07 ENCOUNTER — Other Ambulatory Visit (HOSPITAL_COMMUNITY)
Admission: RE | Admit: 2020-08-07 | Discharge: 2020-08-07 | Disposition: A | Discharge status: Home / Self Care | Payer: 59 | Source: Ambulatory Visit | Attending: Plastic Surgery | Admitting: Plastic Surgery

## 2020-08-07 DIAGNOSIS — Z20822 Contact with and (suspected) exposure to covid-19: Secondary | ICD-10-CM | POA: Diagnosis not present

## 2020-08-07 DIAGNOSIS — Z01812 Encounter for preprocedural laboratory examination: Secondary | ICD-10-CM | POA: Diagnosis not present

## 2020-08-08 LAB — SARS CORONAVIRUS 2 (TAT 6-24 HRS): SARS Coronavirus 2: NEGATIVE

## 2020-08-10 NOTE — Anesthesia Preprocedure Evaluation (Addendum)
Anesthesia Evaluation  Patient identified by MRN, date of birth, ID band Patient awake    Reviewed: Allergy & Precautions, NPO status , Patient's Chart, lab work & pertinent test results  History of Anesthesia Complications (+) PONV and history of anesthetic complications  Airway Mallampati: I       Dental no notable dental hx.    Pulmonary neg pulmonary ROS,    Pulmonary exam normal        Cardiovascular negative cardio ROS Normal cardiovascular exam     Neuro/Psych PSYCHIATRIC DISORDERS Anxiety Depression negative neurological ROS     GI/Hepatic negative GI ROS, Neg liver ROS,   Endo/Other  negative endocrine ROS  Renal/GU negative Renal ROS  negative genitourinary   Musculoskeletal negative musculoskeletal ROS (+)   Abdominal Normal abdominal exam  (+)   Peds  Hematology negative hematology ROS (+)   Anesthesia Other Findings   Reproductive/Obstetrics negative OB ROS                            Anesthesia Physical Anesthesia Plan  ASA: II  Anesthesia Plan: General   Post-op Pain Management:    Induction:   PONV Risk Score and Plan: 4 or greater and Ondansetron, Dexamethasone, Midazolam and Scopolamine patch - Pre-op  Airway Management Planned: LMA and Oral ETT  Additional Equipment: None  Intra-op Plan:   Post-operative Plan: Extubation in OR  Informed Consent: I have reviewed the patients History and Physical, chart, labs and discussed the procedure including the risks, benefits and alternatives for the proposed anesthesia with the patient or authorized representative who has indicated his/her understanding and acceptance.     Dental advisory given  Plan Discussed with: CRNA  Anesthesia Plan Comments:        Anesthesia Quick Evaluation

## 2020-08-11 ENCOUNTER — Encounter (HOSPITAL_BASED_OUTPATIENT_CLINIC_OR_DEPARTMENT_OTHER)
Admission: RE | Disposition: A | Discharge status: Home / Self Care | Payer: Self-pay | Source: Home / Self Care | Attending: Plastic Surgery

## 2020-08-11 ENCOUNTER — Encounter (HOSPITAL_BASED_OUTPATIENT_CLINIC_OR_DEPARTMENT_OTHER): Payer: Self-pay | Admitting: Plastic Surgery

## 2020-08-11 ENCOUNTER — Ambulatory Visit (HOSPITAL_BASED_OUTPATIENT_CLINIC_OR_DEPARTMENT_OTHER): Payer: 59 | Admitting: Anesthesiology

## 2020-08-11 ENCOUNTER — Other Ambulatory Visit: Payer: Self-pay

## 2020-08-11 ENCOUNTER — Ambulatory Visit (HOSPITAL_BASED_OUTPATIENT_CLINIC_OR_DEPARTMENT_OTHER)
Admission: RE | Admit: 2020-08-11 | Discharge: 2020-08-11 | Disposition: A | Discharge status: Home / Self Care | Payer: 59 | Attending: Plastic Surgery | Admitting: Plastic Surgery

## 2020-08-11 DIAGNOSIS — Z79899 Other long term (current) drug therapy: Secondary | ICD-10-CM | POA: Insufficient documentation

## 2020-08-11 DIAGNOSIS — F410 Panic disorder [episodic paroxysmal anxiety] without agoraphobia: Secondary | ICD-10-CM | POA: Diagnosis not present

## 2020-08-11 DIAGNOSIS — N62 Hypertrophy of breast: Secondary | ICD-10-CM | POA: Insufficient documentation

## 2020-08-11 DIAGNOSIS — F325 Major depressive disorder, single episode, in full remission: Secondary | ICD-10-CM | POA: Diagnosis not present

## 2020-08-11 HISTORY — DX: Other specified postprocedural states: Z98.890

## 2020-08-11 HISTORY — DX: Other specified postprocedural states: R11.2

## 2020-08-11 HISTORY — PX: BREAST REDUCTION SURGERY: SHX8

## 2020-08-11 LAB — POCT PREGNANCY, URINE: Preg Test, Ur: NEGATIVE

## 2020-08-11 SURGERY — MAMMOPLASTY, REDUCTION
Anesthesia: General | Site: Breast | Laterality: Bilateral

## 2020-08-11 MED ORDER — BUPIVACAINE HCL (PF) 0.5 % IJ SOLN
INTRAMUSCULAR | Status: AC
  Filled 2020-08-11: qty 30

## 2020-08-11 MED ORDER — LACTATED RINGERS IV SOLN: INTRAVENOUS | Status: DC

## 2020-08-11 MED ORDER — EPHEDRINE 5 MG/ML INJ
INTRAVENOUS | Status: AC
  Filled 2020-08-11: qty 10

## 2020-08-11 MED ORDER — ONDANSETRON HCL 4 MG/2ML IJ SOLN
INTRAMUSCULAR | Status: AC
  Filled 2020-08-11: qty 8

## 2020-08-11 MED ORDER — OXYCODONE HCL 5 MG PO TABS
5.0000 mg | ORAL_TABLET | Freq: Once | ORAL | Status: AC
  Administered 2020-08-11: 5 mg via ORAL

## 2020-08-11 MED ORDER — PROPOFOL 10 MG/ML IV BOLUS
INTRAVENOUS | Status: AC
  Filled 2020-08-11: qty 20

## 2020-08-11 MED ORDER — PROPOFOL 10 MG/ML IV BOLUS
INTRAVENOUS | Status: DC | PRN
  Administered 2020-08-11: 150 mg via INTRAVENOUS

## 2020-08-11 MED ORDER — PROPOFOL 500 MG/50ML IV EMUL
INTRAVENOUS | Status: DC | PRN
  Administered 2020-08-11: 50 ug/kg/min via INTRAVENOUS

## 2020-08-11 MED ORDER — HEPARIN SODIUM (PORCINE) 5000 UNIT/ML IJ SOLN
INTRAMUSCULAR | Status: AC
  Filled 2020-08-11: qty 1

## 2020-08-11 MED ORDER — FENTANYL CITRATE (PF) 100 MCG/2ML IJ SOLN
INTRAMUSCULAR | Status: AC
  Filled 2020-08-11: qty 2

## 2020-08-11 MED ORDER — PROPOFOL 500 MG/50ML IV EMUL
INTRAVENOUS | Status: AC
  Filled 2020-08-11: qty 100

## 2020-08-11 MED ORDER — BUPIVACAINE-EPINEPHRINE (PF) 0.25% -1:200000 IJ SOLN
INTRAMUSCULAR | Status: AC
  Filled 2020-08-11: qty 30

## 2020-08-11 MED ORDER — CHLORHEXIDINE GLUCONATE CLOTH 2 % EX PADS: 6.0000 | MEDICATED_PAD | Freq: Once | CUTANEOUS | Status: DC

## 2020-08-11 MED ORDER — DEXMEDETOMIDINE (PRECEDEX) IN NS 20 MCG/5ML (4 MCG/ML) IV SYRINGE
PREFILLED_SYRINGE | INTRAVENOUS | Status: AC
  Filled 2020-08-11: qty 5

## 2020-08-11 MED ORDER — BUPIVACAINE-EPINEPHRINE (PF) 0.5% -1:200000 IJ SOLN
INTRAMUSCULAR | Status: AC
  Filled 2020-08-11: qty 30

## 2020-08-11 MED ORDER — DEXMEDETOMIDINE (PRECEDEX) IN NS 20 MCG/5ML (4 MCG/ML) IV SYRINGE
PREFILLED_SYRINGE | INTRAVENOUS | Status: DC | PRN
  Administered 2020-08-11: 8 ug via INTRAVENOUS

## 2020-08-11 MED ORDER — MIDAZOLAM HCL 5 MG/5ML IJ SOLN
INTRAMUSCULAR | Status: DC | PRN
  Administered 2020-08-11: 2 mg via INTRAVENOUS

## 2020-08-11 MED ORDER — EPHEDRINE SULFATE 50 MG/ML IJ SOLN
INTRAMUSCULAR | Status: DC | PRN
  Administered 2020-08-11: 10 mg via INTRAVENOUS

## 2020-08-11 MED ORDER — CEFAZOLIN SODIUM-DEXTROSE 2-4 GM/100ML-% IV SOLN
INTRAVENOUS | Status: AC
  Filled 2020-08-11: qty 100

## 2020-08-11 MED ORDER — FENTANYL CITRATE (PF) 100 MCG/2ML IJ SOLN
INTRAMUSCULAR | Status: DC | PRN
  Administered 2020-08-11 (×2): 50 ug via INTRAVENOUS
  Administered 2020-08-11 (×2): 25 ug via INTRAVENOUS
  Administered 2020-08-11: 100 ug via INTRAVENOUS
  Administered 2020-08-11: 50 ug via INTRAVENOUS

## 2020-08-11 MED ORDER — DEXAMETHASONE SODIUM PHOSPHATE 10 MG/ML IJ SOLN
INTRAMUSCULAR | Status: AC
  Filled 2020-08-11: qty 2

## 2020-08-11 MED ORDER — OXYCODONE HCL 5 MG PO TABS
ORAL_TABLET | ORAL | Status: AC
  Filled 2020-08-11: qty 1

## 2020-08-11 MED ORDER — DEXAMETHASONE SODIUM PHOSPHATE 4 MG/ML IJ SOLN
INTRAMUSCULAR | Status: DC | PRN
  Administered 2020-08-11: 10 mg via INTRAVENOUS

## 2020-08-11 MED ORDER — ROCURONIUM BROMIDE 10 MG/ML (PF) SYRINGE
PREFILLED_SYRINGE | INTRAVENOUS | Status: AC
  Filled 2020-08-11: qty 10

## 2020-08-11 MED ORDER — BUPIVACAINE HCL (PF) 0.25 % IJ SOLN
INTRAMUSCULAR | Status: AC
  Filled 2020-08-11: qty 30

## 2020-08-11 MED ORDER — SUGAMMADEX SODIUM 200 MG/2ML IV SOLN
INTRAVENOUS | Status: DC | PRN
  Administered 2020-08-11: 200 mg via INTRAVENOUS

## 2020-08-11 MED ORDER — LIDOCAINE 2% (20 MG/ML) 5 ML SYRINGE
INTRAMUSCULAR | Status: AC
  Filled 2020-08-11: qty 10

## 2020-08-11 MED ORDER — ONDANSETRON HCL 4 MG/2ML IJ SOLN
INTRAMUSCULAR | Status: DC | PRN
  Administered 2020-08-11: 4 mg via INTRAVENOUS

## 2020-08-11 MED ORDER — KETOROLAC TROMETHAMINE 30 MG/ML IJ SOLN: 30.0000 mg | Freq: Once | INTRAMUSCULAR | Status: DC | PRN

## 2020-08-11 MED ORDER — HEPARIN SODIUM (PORCINE) 5000 UNIT/ML IJ SOLN
5000.0000 [IU] | Freq: Once | INTRAMUSCULAR | Status: AC
  Administered 2020-08-11: 5000 [IU] via SUBCUTANEOUS

## 2020-08-11 MED ORDER — ROCURONIUM BROMIDE 100 MG/10ML IV SOLN
INTRAVENOUS | Status: DC | PRN
  Administered 2020-08-11: 20 mg via INTRAVENOUS
  Administered 2020-08-11: 60 mg via INTRAVENOUS

## 2020-08-11 MED ORDER — LIDOCAINE HCL (CARDIAC) PF 100 MG/5ML IV SOSY
PREFILLED_SYRINGE | INTRAVENOUS | Status: DC | PRN
  Administered 2020-08-11: 100 mg via INTRAVENOUS

## 2020-08-11 MED ORDER — CEFAZOLIN SODIUM-DEXTROSE 2-4 GM/100ML-% IV SOLN
2.0000 g | INTRAVENOUS | Status: AC
  Administered 2020-08-11: 2 g via INTRAVENOUS

## 2020-08-11 MED ORDER — LIDOCAINE HCL (PF) 1 % IJ SOLN
INTRAMUSCULAR | Status: AC
  Filled 2020-08-11: qty 30

## 2020-08-11 MED ORDER — BACITRACIN ZINC 500 UNIT/GM EX OINT
TOPICAL_OINTMENT | CUTANEOUS | Status: AC
  Filled 2020-08-11: qty 28.35

## 2020-08-11 MED ORDER — ONDANSETRON HCL 4 MG/2ML IJ SOLN: 4.0000 mg | Freq: Once | INTRAMUSCULAR | Status: DC | PRN

## 2020-08-11 MED ORDER — LIDOCAINE-EPINEPHRINE 1 %-1:100000 IJ SOLN
INTRAMUSCULAR | Status: AC
  Filled 2020-08-11: qty 1

## 2020-08-11 MED ORDER — SCOPOLAMINE 1 MG/3DAYS TD PT72
MEDICATED_PATCH | TRANSDERMAL | Status: DC | PRN
  Administered 2020-08-11: 1 via TRANSDERMAL

## 2020-08-11 MED ORDER — FENTANYL CITRATE (PF) 100 MCG/2ML IJ SOLN
25.0000 ug | INTRAMUSCULAR | Status: DC | PRN
  Administered 2020-08-11: 50 ug via INTRAVENOUS

## 2020-08-11 MED ORDER — MEPERIDINE HCL 25 MG/ML IJ SOLN: 6.2500 mg | INTRAMUSCULAR | Status: DC | PRN

## 2020-08-11 MED ORDER — MIDAZOLAM HCL 2 MG/2ML IJ SOLN
INTRAMUSCULAR | Status: AC
  Filled 2020-08-11: qty 2

## 2020-08-11 SURGICAL SUPPLY — 57 items
ADH SKN CLS APL DERMABOND .7 (GAUZE/BANDAGES/DRESSINGS) ×2
APL PRP STRL LF DISP 70% ISPRP (MISCELLANEOUS) ×2
BALL CTTN LRG ABS STRL LF (GAUZE/BANDAGES/DRESSINGS)
BINDER BREAST XLRG (GAUZE/BANDAGES/DRESSINGS) ×1 IMPLANT
BLADE HEX COATED 2.75 (ELECTRODE) ×1 IMPLANT
BLADE SURG 10 STRL SS (BLADE) ×4 IMPLANT
BLADE SURG 15 STRL LF DISP TIS (BLADE) ×1 IMPLANT
BLADE SURG 15 STRL SS (BLADE) ×2
BNDG ELASTIC 6X5.8 VLCR STR LF (GAUZE/BANDAGES/DRESSINGS) ×3 IMPLANT
BNDG EYE OVAL (GAUZE/BANDAGES/DRESSINGS) IMPLANT
CANISTER SUCT 1200ML W/VALVE (MISCELLANEOUS) ×2 IMPLANT
CHLORAPREP W/TINT 26 (MISCELLANEOUS) ×3 IMPLANT
COTTONBALL LRG STERILE PKG (GAUZE/BANDAGES/DRESSINGS) IMPLANT
COVER BACK TABLE 60X90IN (DRAPES) ×2 IMPLANT
COVER MAYO STAND STRL (DRAPES) ×2 IMPLANT
COVER WAND RF STERILE (DRAPES) IMPLANT
DERMABOND ADVANCED (GAUZE/BANDAGES/DRESSINGS) ×2
DERMABOND ADVANCED .7 DNX12 (GAUZE/BANDAGES/DRESSINGS) ×2 IMPLANT
DRAIN CHANNEL 19F RND (DRAIN) ×2 IMPLANT
DRAPE LAPAROSCOPIC ABDOMINAL (DRAPES) ×2 IMPLANT
DRAPE UTILITY XL STRL (DRAPES) ×2 IMPLANT
DRSG PAD ABDOMINAL 8X10 ST (GAUZE/BANDAGES/DRESSINGS) ×8 IMPLANT
ELECT REM PT RETURN 9FT ADLT (ELECTROSURGICAL) ×2
ELECTRODE REM PT RTRN 9FT ADLT (ELECTROSURGICAL) ×1 IMPLANT
EVACUATOR SILICONE 100CC (DRAIN) ×2 IMPLANT
GAUZE SPONGE 4X4 12PLY STRL (GAUZE/BANDAGES/DRESSINGS) ×4 IMPLANT
GAUZE SPONGE 4X4 12PLY STRL LF (GAUZE/BANDAGES/DRESSINGS) ×2 IMPLANT
GLOVE SRG 8 PF TXTR STRL LF DI (GLOVE) ×1 IMPLANT
GLOVE SURG ENC MOIS LTX SZ7.5 (GLOVE) ×2 IMPLANT
GLOVE SURG ENC MOIS LTX SZ8 (GLOVE) IMPLANT
GLOVE SURG UNDER POLY LF SZ8 (GLOVE) ×2
GOWN STRL REUS W/ TWL LRG LVL3 (GOWN DISPOSABLE) ×1 IMPLANT
GOWN STRL REUS W/ TWL XL LVL3 (GOWN DISPOSABLE) ×1 IMPLANT
GOWN STRL REUS W/TWL LRG LVL3 (GOWN DISPOSABLE) ×2
GOWN STRL REUS W/TWL XL LVL3 (GOWN DISPOSABLE) ×2
NS IRRIG 1000ML POUR BTL (IV SOLUTION) ×2 IMPLANT
PACK BASIN DAY SURGERY FS (CUSTOM PROCEDURE TRAY) ×2 IMPLANT
PENCIL SMOKE EVACUATOR (MISCELLANEOUS) ×2 IMPLANT
PIN SAFETY STERILE (MISCELLANEOUS) ×1 IMPLANT
SLEEVE SCD COMPRESS KNEE MED (STOCKING) ×1 IMPLANT
SPECIMEN JAR X LARGE (MISCELLANEOUS) ×4 IMPLANT
SPONGE LAP 18X18 RF (DISPOSABLE) ×8 IMPLANT
STAPLER INSORB 30 2030 C-SECTI (MISCELLANEOUS) ×1 IMPLANT
SUT ETHILON 3 0 PS 1 (SUTURE) ×2 IMPLANT
SUT MNCRL AB 3-0 PS2 18 (SUTURE) ×12 IMPLANT
SUT MNCRL AB 4-0 PS2 18 (SUTURE) ×6 IMPLANT
SUT MON AB 2-0 CT1 36 (SUTURE) ×4 IMPLANT
SUT PROLENE 4 0 PS 2 18 (SUTURE) IMPLANT
SUT PROLENE 5 0 PS 2 (SUTURE) IMPLANT
SUT SILK 4 0 PS 2 (SUTURE) IMPLANT
SUT VIC AB 2-0 PS2 27 (SUTURE) IMPLANT
SUT VICRYL 3-0 CR8 SH (SUTURE) IMPLANT
SYR BULB IRRIG 60ML STRL (SYRINGE) ×4 IMPLANT
TOWEL GREEN STERILE FF (TOWEL DISPOSABLE) ×6 IMPLANT
TUBE CONNECTING 20X1/4 (TUBING) ×3 IMPLANT
UNDERPAD 30X36 HEAVY ABSORB (UNDERPADS AND DIAPERS) ×4 IMPLANT
YANKAUER SUCT BULB TIP NO VENT (SUCTIONS) ×2 IMPLANT

## 2020-08-11 NOTE — Transfer of Care (Signed)
Immediate Anesthesia Transfer of Care Note  Patient: Krystal Peters  Procedure(s) Performed: MAMMARY REDUCTION  (BREAST) (Bilateral Breast)  Patient Location: PACU  Anesthesia Type:General  Level of Consciousness: awake, alert , oriented and patient cooperative  Airway & Oxygen Therapy: Patient Spontanous Breathing and Patient connected to face mask oxygen  Post-op Assessment: Report given to RN and Post -op Vital signs reviewed and stable  Post vital signs: Reviewed and stable  Last Vitals:  Vitals Value Taken Time  BP    Temp    Pulse    Resp    SpO2      Last Pain:  Vitals:   08/11/20 0652  TempSrc: Oral  PainSc: 0-No pain      Patients Stated Pain Goal: 5 (62/70/35 0093)  Complications: No complications documented.

## 2020-08-11 NOTE — Brief Op Note (Signed)
08/11/2020  11:36 AM  PATIENT:  Krystal Peters  20 y.o. female  PRE-OPERATIVE DIAGNOSIS:  macromastia  POST-OPERATIVE DIAGNOSIS:  macromastia  PROCEDURE:  Procedure(s): MAMMARY REDUCTION  (BREAST) (Bilateral)  SURGEON:  Surgeon(s) and Role:    * Crissie Reese, MD - Primary  PHYSICIAN ASSISTANT:   ASSISTANTS: none   ANESTHESIA:   general  EBL:  100 mL   BLOOD ADMINISTERED:none  DRAINS: 19 French Blake left and right breast  LOCAL MEDICATIONS USED:  NONE  SPECIMEN:  Source of Specimen:  Right and left breast tissue  DISPOSITION OF SPECIMEN:  PATHOLOGY  COUNTS:  YES  TOURNIQUET:  * No tourniquets in log *  DICTATION: .Other Dictation: Dictation Number [id number]  PLAN OF CARE: Discharge to home after PACU  PATIENT DISPOSITION:  PACU - hemodynamically stable.   Delay start of Pharmacological VTE agent (>24hrs) due to surgical blood loss or risk of bleeding: not applicable

## 2020-08-11 NOTE — Anesthesia Postprocedure Evaluation (Signed)
Anesthesia Post Note  Patient: Krystal Peters  Procedure(s) Performed: MAMMARY REDUCTION  (BREAST) (Bilateral Breast)     Patient location during evaluation: Phase II Anesthesia Type: General Level of consciousness: awake Pain management: pain level controlled Vital Signs Assessment: post-procedure vital signs reviewed and stable Respiratory status: spontaneous breathing Cardiovascular status: stable Postop Assessment: no apparent nausea or vomiting Anesthetic complications: no   No complications documented.  Last Vitals:  Vitals:   08/11/20 1145 08/11/20 1200  BP: 124/75 131/76  Pulse: 99 (!) 105  Resp: 13 15  Temp:    SpO2: 100% 97%    Last Pain:  Vitals:   08/11/20 1214  TempSrc:   PainSc: 2                  [name]

## 2020-08-11 NOTE — Op Note (Signed)
Krystal Peters, Krystal Peters MEDICAL RECORD NO: [id number] ACCOUNT NO: [id number] DATE OF BIRTH: Oct 06, 2000 FACILITY: [facility] LOCATION: [facility] PHYSICIAN: Youlanda Roys. Harlow Mares, MD  Operative Report   DATE OF PROCEDURE: 08/11/2020  PREOPERATIVE DIAGNOSIS:  Bilateral macromastia.  POSTOPERATIVE DIAGNOSIS:  Bilateral macromastia.  PROCEDURE:  Bilateral breast reduction, inferior pedicle technique.  SURGEON:  Youlanda Roys. Harlow Mares, MD.  ANESTHESIA:  General.  ESTIMATED BLOOD LOSS:  200 mL  DRAINS:  One 19-French on each side.  CLINICAL NOTE:  A 20 year old woman complains of large breasts with upper back pain, neck pain, shoulder pain, bra strap shoulder grooving with occasional episodes of rash in the intertriginous fold under the breast.  She desired breast reduction.   Initial procedure, risks and possible complications were discussed with her in detail and she understood all of these.  All of her questions were answered.  She wished to proceed.  DESCRIPTION OF PROCEDURE:  The patient was marked in the holding area in a full standing position.  She was then taken to the operating room and placed supine.  After successful induction of general anesthesia she was prepped with ChloraPrep.  After  waiting 3 minutes for drying, she was draped with sterile drapes.  An 8 cm wide inferior nipple areolar pedicles were designed in the 42 mm marker, marked the nipple areolar complexes.  Incisions were made and the inferior pedicles were  de-epithelialized.  Pedicle was then isolated from surrounding tissue medial, central and lateral using electrocautery.  A generous base of tissue was left medially with this dissection.  Care was taken to be certain that the attachment was broader at  the chest wall that was present at the level of the skin.  The nipple complexes had excellent color and bright red blood around the periphery consistent with viability at the conclusion of this dissection.  The resections were  performed of medial,  central and lateral.  A total of 500 grams was resected from the right side and 507 grams from the left side.  The wounds were then irrigated thoroughly with saline.  Meticulous hemostasis was achieved using electrocautery.  A 19-French drain was  positioned, brought out through the lateral aspect of the wound and secured with a 3-0 nylon suture.  The closures were performed with 2-0 Monocryl and Insorb stapling device, deep, followed by 3-0 Monocryl running subcuticular sutures.  A measurement  was then taken approximately 5 cm from the inframammary fold and the 42 mm marker, marked the site for the nipple-areolar complex.  This tissue was resected.  Thorough irrigation with saline.  Again, meticulous hemostasis with electrocautery.  Excellent  hemostasis having been confirmed.  The nipple areolar complex was brought through this opening.  They were inspected and again found to have good color with bright red bleeding around the periphery consistent with viability.  Nipple areolar insetting  with 3-0 Monocryl interrupted inverted deep dermal sutures and running 4-0 Monocryl subcuticular suture.  Dermabond, dry sterile dressings, circumferential Ace wrap and a breast binder were then placed and she was transferred to the recovery room in  stable, having tolerated the procedure well.  DISPOSITION:  She will be seen in the office tomorrow for recheck.   PUS D: 08/11/2020 11:26:56 am T: 08/11/2020 1:02:00 pm  JOB: [id number]/ [id number]

## 2020-08-11 NOTE — Discharge Instructions (Addendum)
No lifting for 6 weeks No vigorous activity for 6 weeks (including outdoor walks) No driving for 4 weeks OK to walk up stairs slowly Stay propped up Use incentive spirometer at home every hour while awake No shower while drains are in place Empty drains at least three times a day and record the amounts separately Take an over-the-counter stool softener (such as Colace) while on pain medication Expect drainage from incisions Do not use heat or ice  Do not raise arms overhead Continue to avoid all of the over the counter blood thinning agents we discussed Return to office tomorrow  For questions call [phone] or after hours and weekends call [phone]   Post Anesthesia Home Care Instructions  Activity: Get plenty of rest for the remainder of the day. A responsible individual must stay with you for 24 hours following the procedure.  For the next 24 hours, DO NOT: -Drive a car -Paediatric nurse -Drink alcoholic beverages -Take any medication unless instructed by your physician -Make any legal decisions or sign important papers.  Meals: Start with liquid foods such as gelatin or soup. Progress to regular foods as tolerated. Avoid greasy, spicy, heavy foods. If nausea and/or vomiting occur, drink only clear liquids until the nausea and/or vomiting subsides. Call your physician if vomiting continues.  Special Instructions/Symptoms: Your throat may feel dry or sore from the anesthesia or the breathing tube placed in your throat during surgery. If this causes discomfort, gargle with warm salt water. The discomfort should disappear within 24 hours.  If you had a scopolamine patch placed behind your ear for the management of post- operative nausea and/or vomiting:  1. The medication in the patch is effective for 72 hours, after which it should be removed.  Wrap patch in a tissue and discard in the trash. Wash hands thoroughly with soap and water. 2. You may remove the patch earlier  than 72 hours if you experience unpleasant side effects which may include dry mouth, dizziness or visual disturbances. 3. Avoid touching the patch. Wash your hands with soap and water after contact with the patch.     About my Jackson-Pratt Bulb Drain  What is a Jackson-Pratt bulb? A Jackson-Pratt is a soft, round device used to collect drainage. It is connected to a long, thin drainage catheter, which is held in place by one or two small stiches near your surgical incision site. When the bulb is squeezed, it forms a vacuum, forcing the drainage to empty into the bulb.  Emptying the Jackson-Pratt bulb- To empty the bulb: 1. Release the plug on the top of the bulb. 2. Pour the bulb's contents into a measuring container which your nurse will provide. 3. Record the time emptied and amount of drainage. Empty the drain(s) as often as your     doctor or nurse recommends.  Date                  Time                    Amount (Drain 1)                 Amount (Drain 2)  _____________________________________________________________________  _____________________________________________________________________  _____________________________________________________________________  _____________________________________________________________________  _____________________________________________________________________  _____________________________________________________________________  _____________________________________________________________________  _____________________________________________________________________  Squeezing the Jackson-Pratt Bulb- To squeeze the bulb: 1. Make sure the plug at the top of the bulb is open. 2. Squeeze the bulb tightly in your fist. You will hear air squeezing from the bulb.  3. Replace the plug while the bulb is squeezed. 4. Use a safety pin to attach the bulb to your clothing. This will keep the catheter from     pulling at the bulb insertion  site.  When to call your doctor- Call your doctor if:  Drain site becomes red, swollen or hot.  You have a fever greater than 101 degrees F.  There is oozing at the drain site.  Drain falls out (apply a guaze bandage over the drain hole and secure it with tape).  Drainage increases daily not related to activity patterns. (You will usually have more drainage when you are active than when you are resting.)  Drainage has a bad odor.

## 2020-08-11 NOTE — H&P (Signed)
I have re-evaluated and re-examined the patient and there are no changes.   See paper chart for office notes/ H&P.

## 2020-08-11 NOTE — Anesthesia Procedure Notes (Signed)
Procedure Name: Intubation Date/Time: 08/11/2020 7:44 AM Performed by: Signe Colt, CRNA Pre-anesthesia Checklist: Patient identified, Emergency Drugs available, Suction available and Patient being monitored Patient Re-evaluated:Patient Re-evaluated prior to induction Oxygen Delivery Method: Circle system utilized Preoxygenation: Pre-oxygenation with 100% oxygen Induction Type: IV induction Ventilation: Mask ventilation without difficulty Laryngoscope Size: Mac and 3 Grade View: Grade I Tube type: Oral Tube size: 7.0 mm Number of attempts: 1 Airway Equipment and Method: Stylet and Oral airway Placement Confirmation: ETT inserted through vocal cords under direct vision,  positive ETCO2 and breath sounds checked- equal and bilateral Secured at: 21 cm Tube secured with: Tape Dental Injury: Teeth and Oropharynx as per pre-operative assessment

## 2020-08-12 ENCOUNTER — Encounter (HOSPITAL_BASED_OUTPATIENT_CLINIC_OR_DEPARTMENT_OTHER): Payer: Self-pay | Admitting: Plastic Surgery

## 2020-08-12 LAB — SURGICAL PATHOLOGY

## 2020-08-20 ENCOUNTER — Other Ambulatory Visit (HOSPITAL_COMMUNITY): Payer: Self-pay

## 2020-08-20 MED FILL — Escitalopram Oxalate Tab 10 MG (Base Equiv): ORAL | 90 days supply | Qty: 90 | Fill #0 | Status: AC

## 2020-09-02 ENCOUNTER — Other Ambulatory Visit: Payer: Self-pay

## 2020-09-02 ENCOUNTER — Ambulatory Visit: Payer: 59 | Admitting: Adult Health

## 2020-09-02 ENCOUNTER — Encounter: Payer: Self-pay | Admitting: Adult Health

## 2020-09-02 ENCOUNTER — Ambulatory Visit (INDEPENDENT_AMBULATORY_CARE_PROVIDER_SITE_OTHER): Payer: 59 | Admitting: Adult Health

## 2020-09-02 ENCOUNTER — Other Ambulatory Visit (HOSPITAL_COMMUNITY): Payer: Self-pay

## 2020-09-02 DIAGNOSIS — F411 Generalized anxiety disorder: Secondary | ICD-10-CM

## 2020-09-02 DIAGNOSIS — F325 Major depressive disorder, single episode, in full remission: Secondary | ICD-10-CM

## 2020-09-02 DIAGNOSIS — F410 Panic disorder [episodic paroxysmal anxiety] without agoraphobia: Secondary | ICD-10-CM | POA: Diagnosis not present

## 2020-09-02 MED ORDER — SERTRALINE HCL 50 MG PO TABS
50.0000 mg | ORAL_TABLET | Freq: Every day | ORAL | 5 refills | Status: DC
  Filled 2020-09-02: qty 30, 30d supply, fill #0

## 2020-09-02 MED ORDER — ALPRAZOLAM 0.5 MG PO TABS
0.5000 mg | ORAL_TABLET | Freq: Three times a day (TID) | ORAL | 2 refills | Status: DC
  Filled 2020-09-02: qty 90, 30d supply, fill #0
  Filled 2020-10-05: qty 90, 30d supply, fill #1
  Filled 2020-11-05: qty 90, 30d supply, fill #2

## 2020-09-02 NOTE — Progress Notes (Signed)
Krystal Peters [id number] 05/11/2000 20 y.o.  Subjective:   Patient ID:  Krystal Peters is a 20 y.o. (DOB Apr 16, 2001) female.  Chief Complaint: No chief complaint on file.   HPI NANAKO STOPHER presents to the office today for follow-up of anxiety and panic attacks.  Describes mood today as "ok". Pleasant. Mood symptoms - reports depression and anxiety. Feels irritable at times. Taking Lexapro 20mg  - had reduced to 10mg  daily. Having panic attacks - "out of no where". Nervous stomach. Light headed. Had to call her mother recently to come and get her from work with a panic attack. Notes taking 2 Xanax, which she has never done. Had not ever needed more than one. Since then panic attacks have gotten worse in intensity. Felt impending doom and has been terrified. Stating "I can usually use my techniques to help myself". Increased Lexapro back to 20mg  and does not feel like it is helpful. Has tried all the techniques she knows to use. Stating "I'm at a loss as to what to do". Stable interest and motivation. Taking medications as prescribed.  Energy levels stable. Active, does not have a regular exercise routine. Walking and running on tread mill. Enjoys some usual interests and activities. Single. Student. Lives with 2 room-mates. Mother in [place]. Father in [place]. Spending time with family. Appetite adequate. Weight gain - off Depo. Sleeps well most nights. Averages 7 to 8 hours. Focus and concentration stable. Completing tasks. Managing aspects of household. 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  Denies AH or VH.  Previous medication trials: Lexapro, Xanax    PHQ2-9   Flowsheet Row Office Visit from 01/10/2017 in [place] Visit from 10/04/2016 in [place] Visit from 09/15/2016 in [place] Visit from 06/07/2016 in [place] Visit from 12/30/2015 in  [place]  PHQ-2 Total Score 0 0 0 0 0  PHQ-9 Total Score -- -- -- 0 0    Flowsheet Row Admission (Discharged) from 08/11/2020 in [place] [facility] No Risk       Review of Systems:  Review of Systems  Musculoskeletal: Negative for gait problem.  Neurological: Negative for tremors.  Psychiatric/Behavioral:       Please refer to HPI    Medications: I have reviewed the patient's current medications.  Current Outpatient Medications  Medication Sig Dispense Refill  . ALPRAZolam (XANAX) 0.5 MG tablet Take 1 tablet (0.5 mg total) by mouth 3 (three) times daily. 90 tablet 2  . sertraline (ZOLOFT) 50 MG tablet Take 1 tablet (50 mg total) by mouth daily. 30 tablet 5   No current facility-administered medications for this visit.    Medication Side Effects: None  Allergies:  Allergies  Allergen Reactions  . Shellfish Allergy Other (See Comments)    Unknown-not specifically determined Unknown-not specifically determined    Past Medical History:  Diagnosis Date  . Anxiety   . Cystitis   . Depression   . History of UTI   . PONV (postoperative nausea and vomiting)     Past Medical History, Surgical history, Social history, and Family history were reviewed and updated as appropriate.   Please see review of systems for further details on the patient's review from today.   Objective:   Physical Exam:  There were no vitals taken for this visit.  Physical Exam Constitutional:      General: She is not  in acute distress. Musculoskeletal:        General: No deformity.  Neurological:     Mental Status: She is alert and oriented to person, place, and time.     Coordination: Coordination normal.  Psychiatric:        Attention and Perception: Attention and perception normal. She does not perceive auditory or visual hallucinations.        Mood and Affect: Mood normal. Mood is not anxious or depressed. Affect is not labile, blunt, angry or  inappropriate.        Speech: Speech normal.        Behavior: Behavior normal.        Thought Content: Thought content normal. Thought content is not paranoid or delusional. Thought content does not include homicidal or suicidal ideation. Thought content does not include homicidal or suicidal plan.        Cognition and Memory: Cognition and memory normal.        Judgment: Judgment normal.     Comments: Insight intact     Lab Review:     Component Value Date/Time   NA 137 07/14/2019 1920   NA 140 01/10/2017 1819   K 3.5 07/14/2019 1920   CL 105 07/14/2019 1920   CO2 22 07/14/2019 1920   GLUCOSE 109 (H) 07/14/2019 1920   BUN 15 07/14/2019 1920   BUN 5 01/10/2017 1819   CREATININE 0.91 07/14/2019 1920   CALCIUM 10.0 07/14/2019 1920   PROT 8.8 (H) 07/14/2019 1920   PROT 7.0 01/10/2017 1819   ALBUMIN 5.4 (H) 07/14/2019 1920   ALBUMIN 4.3 01/10/2017 1819   AST 21 07/14/2019 1920   ALT 14 07/14/2019 1920   ALKPHOS 79 07/14/2019 1920   BILITOT 0.9 07/14/2019 1920   BILITOT <0.2 01/10/2017 1819   GFRNONAA >60 07/14/2019 1920   GFRAA >60 07/14/2019 1920       Component Value Date/Time   WBC 19.2 (H) 07/14/2019 1920   RBC 5.50 (H) 07/14/2019 1920   HGB 16.1 (H) 07/14/2019 1920   HGB 12.9 01/10/2017 1819   HCT 49.2 (H) 07/14/2019 1920   HCT 36.9 01/10/2017 1819   PLT 490 (H) 07/14/2019 1920   PLT 161 01/10/2017 1819   MCV 89.5 07/14/2019 1920   MCV 84 01/10/2017 1819   MCH 29.3 07/14/2019 1920   MCHC 32.7 07/14/2019 1920   RDW 12.0 07/14/2019 1920   RDW 13.7 01/10/2017 1819   LYMPHSABS 1.2 01/10/2017 1819   EOSABS 0.1 01/10/2017 1819   BASOSABS 0.0 01/10/2017 1819    No results found for: POCLITH, LITHIUM   No results found for: PHENYTOIN, PHENOBARB, VALPROATE, CBMZ   .res Assessment: Plan:    Plan:  1. Lexapro 20mg  to 10mg  daily x 7 days and d/c 2. Xanax 0.5mg  TID prn to scheduled for panic attacks 3. Add Zoloft 50mg  25 x 7, then 50mg  daily   Read and  reviewed note with patient for accuracy.   RTC 4 weeks   Patient advised to contact office with any questions, adverse effects, or acute worsening in signs and symptoms.  Discussed potential benefits, risk, and side effects of benzodiazepines to include potential risk of tolerance and dependence, as well as possible drowsiness.  Advised patient not to drive if experiencing drowsiness and to take lowest possible effective dose to minimize risk of dependence and tolerance.   Diagnoses and all orders for this visit:  Panic disorder -     sertraline (ZOLOFT) 50 MG tablet; Take  1 tablet (50 mg total) by mouth daily. -     ALPRAZolam (XANAX) 0.5 MG tablet; Take 1 tablet (0.5 mg total) by mouth 3 (three) times daily.  Generalized anxiety disorder -     sertraline (ZOLOFT) 50 MG tablet; Take 1 tablet (50 mg total) by mouth daily. -     ALPRAZolam (XANAX) 0.5 MG tablet; Take 1 tablet (0.5 mg total) by mouth 3 (three) times daily.  Major depression, single episode, in complete remission (HCC) -     sertraline (ZOLOFT) 50 MG tablet; Take 1 tablet (50 mg total) by mouth daily.     Please see After Visit Summary for patient specific instructions.  Future Appointments  Date Time Provider [place]  10/01/2020 11:40 AM Ithan Touhey, Berdie Ogren, NP CP-CP None    No orders of the defined types were placed in this encounter.   -------------------------------

## 2020-09-18 DIAGNOSIS — Z113 Encounter for screening for infections with a predominantly sexual mode of transmission: Secondary | ICD-10-CM | POA: Diagnosis not present

## 2020-09-18 DIAGNOSIS — Z309 Encounter for contraceptive management, unspecified: Secondary | ICD-10-CM | POA: Diagnosis not present

## 2020-09-18 DIAGNOSIS — Z01419 Encounter for gynecological examination (general) (routine) without abnormal findings: Secondary | ICD-10-CM | POA: Diagnosis not present

## 2020-09-18 DIAGNOSIS — Z6828 Body mass index (BMI) 28.0-28.9, adult: Secondary | ICD-10-CM | POA: Diagnosis not present

## 2020-10-01 ENCOUNTER — Other Ambulatory Visit: Payer: Self-pay

## 2020-10-01 ENCOUNTER — Encounter: Payer: Self-pay | Admitting: Adult Health

## 2020-10-01 ENCOUNTER — Ambulatory Visit (INDEPENDENT_AMBULATORY_CARE_PROVIDER_SITE_OTHER): Payer: 59 | Admitting: Adult Health

## 2020-10-01 DIAGNOSIS — F410 Panic disorder [episodic paroxysmal anxiety] without agoraphobia: Secondary | ICD-10-CM | POA: Diagnosis not present

## 2020-10-01 DIAGNOSIS — F411 Generalized anxiety disorder: Secondary | ICD-10-CM

## 2020-10-01 DIAGNOSIS — F325 Major depressive disorder, single episode, in full remission: Secondary | ICD-10-CM

## 2020-10-01 MED ORDER — SERTRALINE HCL 100 MG PO TABS: 100.0000 mg | ORAL_TABLET | Freq: Every day | ORAL | 5 refills | Status: DC

## 2020-10-01 NOTE — Progress Notes (Signed)
Krystal Peters [id number] 12-Jul-2000 20 y.o.  Subjective:   Patient ID:  Krystal Peters is a 20 y.o. (DOB [birth year]) female.  Chief Complaint: No chief complaint on file.   HPI Krystal Peters presents to the office today for follow-up of anxiety and panic attacks.  Describes mood today as "ok". Pleasant. Mood symptoms - reports decreased depression and anxiety. Denies irritability. Has tapered off of Lexapro and is currently taking Zoloft at 50mg  daily. Stating "it's working better for my mood". Reports panic attacks - "less severe". Breast reduction April 12th. [place] then to [place]. Stable interest and motivation. Taking medications as prescribed.  Energy levels stable. Active, has a regular exercise routine. Enjoys some usual interests and activities. Single. Student. Lives with 2 room-mates. Mother in [place]. Father in [place]. Spending time with family. Appetite adequate. Weight loss of 15 pounds with recent breast reduction. Sleeps well most nights. Averages 7 to 8 hours - a little more recently. Focus and concentration stable. Completing tasks. Managing aspects of household. 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  Denies AH or VH.  Previous medication trials: Lexapro, Xanax   PHQ2-9   Flowsheet Row Office Visit from 01/10/2017 in [place] Visit from 10/04/2016 in [place] Visit from 09/15/2016 in [place] Visit from 06/07/2016 in [place] Visit from 12/30/2015 in [place]  PHQ-2 Total Score 0 0 0 0 0  PHQ-9 Total Score -- -- -- 0 0    Flowsheet Row Admission (Discharged) from 08/11/2020 in [place] No Risk       Review of Systems:  Review of Systems  Musculoskeletal: Negative for gait problem.   Neurological: Negative for tremors.  Psychiatric/Behavioral:       Please refer to HPI    Medications: I have reviewed the patient's current medications.  Current Outpatient Medications  Medication Sig Dispense Refill  . sertraline (ZOLOFT) 100 MG tablet Take 1 tablet (100 mg total) by mouth daily. 30 tablet 5  . ALPRAZolam (XANAX) 0.5 MG tablet Take 1 tablet (0.5 mg total) by mouth 3 (three) times daily. 90 tablet 2  . sertraline (ZOLOFT) 50 MG tablet Take 1 tablet (50 mg total) by mouth daily. 30 tablet 5   No current facility-administered medications for this visit.    Medication Side Effects: None  Allergies:  Allergies  Allergen Reactions  . Oxycodone Hives and Rash  . Shellfish Allergy Other (See Comments)    Unknown-not specifically determined Unknown-not specifically determined    Past Medical History:  Diagnosis Date  . Anxiety   . Cystitis   . Depression   . History of UTI   . PONV (postoperative nausea and vomiting)     Past Medical History, Surgical history, Social history, and Family history were reviewed and updated as appropriate.   Please see review of systems for further details on the patient's review from today.   Objective:   Physical Exam:  There were no vitals taken for this visit.  Physical Exam Constitutional:      General: She is not in acute distress. Musculoskeletal:        General: No deformity.  Neurological:     Mental Status: She is alert and oriented to person, place, and time.     Coordination: Coordination normal.  Psychiatric:        Attention and Perception: Attention and perception normal. She does not perceive auditory or visual hallucinations.        Mood and Affect: Mood normal. Mood is not anxious or depressed. Affect is not labile, blunt, angry or inappropriate.        Speech: Speech normal.        Behavior: Behavior normal.        Thought Content: Thought content normal. Thought content is not paranoid or  delusional. Thought content does not include homicidal or suicidal ideation. Thought content does not include homicidal or suicidal plan.        Cognition and Memory: Cognition and memory normal.        Judgment: Judgment normal.     Comments: Insight intact     Lab Review:     Component Value Date/Time   NA 137 07/14/2019 1920   NA 140 01/10/2017 1819   K 3.5 07/14/2019 1920   CL 105 07/14/2019 1920   CO2 22 07/14/2019 1920   GLUCOSE 109 (H) 07/14/2019 1920   BUN 15 07/14/2019 1920   BUN 5 01/10/2017 1819   CREATININE 0.91 07/14/2019 1920   CALCIUM 10.0 07/14/2019 1920   PROT 8.8 (H) 07/14/2019 1920   PROT 7.0 01/10/2017 1819   ALBUMIN 5.4 (H) 07/14/2019 1920   ALBUMIN 4.3 01/10/2017 1819   AST 21 07/14/2019 1920   ALT 14 07/14/2019 1920   ALKPHOS 79 07/14/2019 1920   BILITOT 0.9 07/14/2019 1920   BILITOT <0.2 01/10/2017 1819   GFRNONAA >60 07/14/2019 1920   GFRAA >60 07/14/2019 1920       Component Value Date/Time   WBC 19.2 (H) 07/14/2019 1920   RBC 5.50 (H) 07/14/2019 1920   HGB 16.1 (H) 07/14/2019 1920   HGB 12.9 01/10/2017 1819   HCT 49.2 (H) 07/14/2019 1920   HCT 36.9 01/10/2017 1819   PLT 490 (H) 07/14/2019 1920   PLT 161 01/10/2017 1819   MCV 89.5 07/14/2019 1920   MCV 84 01/10/2017 1819   MCH 29.3 07/14/2019 1920   MCHC 32.7 07/14/2019 1920   RDW 12.0 07/14/2019 1920   RDW 13.7 01/10/2017 1819   LYMPHSABS 1.2 01/10/2017 1819   EOSABS 0.1 01/10/2017 1819   BASOSABS 0.0 01/10/2017 1819    No results found for: POCLITH, LITHIUM   No results found for: PHENYTOIN, PHENOBARB, VALPROATE, CBMZ   .res Assessment: Plan:     Plan:  1. Increase Zoloft 50mg  to 100mg  daily  2. Xanax 0.5mg  TID prn to scheduled for panic attacks - not using as much.   Time spent with patient was 25 minutes. Greater than 50% of face to face time with patient was spent on counseling and coordination of care.   RTC 4/6 weeks   Patient advised to contact office with any  questions, adverse effects, or acute worsening in signs and symptoms.  Discussed potential benefits, risk, and side effects of benzodiazepines to include potential risk of tolerance and dependence, as well as possible drowsiness.  Advised patient not to drive if experiencing drowsiness and to take lowest possible effective dose to minimize risk of dependence and tolerance.    Diagnoses and all orders for this visit:  Panic disorder  Generalized anxiety disorder  Major depression, single episode, in complete remission ([facility])  Other orders -     sertraline (ZOLOFT) 100 MG tablet; Take 1 tablet (100 mg total) by mouth daily.     Please  see After Visit Summary for patient specific instructions.  No future appointments.  No orders of the defined types were placed in this encounter.   -------------------------------

## 2020-10-05 ENCOUNTER — Other Ambulatory Visit (HOSPITAL_COMMUNITY): Payer: Self-pay

## 2020-11-03 ENCOUNTER — Other Ambulatory Visit: Payer: Self-pay | Admitting: Adult Health

## 2020-11-05 ENCOUNTER — Other Ambulatory Visit (HOSPITAL_COMMUNITY): Payer: Self-pay

## 2020-11-12 ENCOUNTER — Ambulatory Visit: Payer: 59 | Admitting: Adult Health

## 2020-11-13 ENCOUNTER — Ambulatory Visit: Payer: 59 | Admitting: Adult Health

## 2020-11-18 IMAGING — CT CT ABD-PELV W/ CM
2 of 4 series · 16 of 46 positions shown, 18 images · IV contrast (Omnipaque or Isovue)
Comparison: June 14, 2017

CLINICAL DATA: Nausea, vomiting and diarrhea.

EXAM:
CT ABDOMEN AND PELVIS WITH CONTRAST
TECHNIQUE: Multidetector CT imaging of the abdomen and pelvis was performed
using the standard protocol following bolus administration of
intravenous contrast.
CONTRAST:  100mL OMNIPAQUE IOHEXOL 300 MG/ML  SOLN

[Series 2: axial st · axial · 0.71mm/px · z∈[+791,+1211]mm · 13 of 96 slices shown, 15 images]
[im 6/96  soft-tissue]
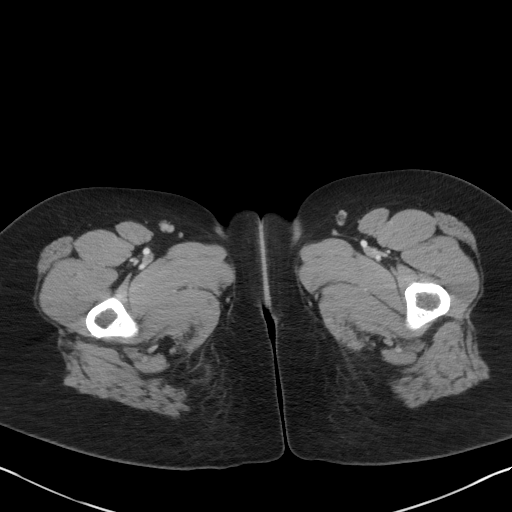
[im 6/96  bone]
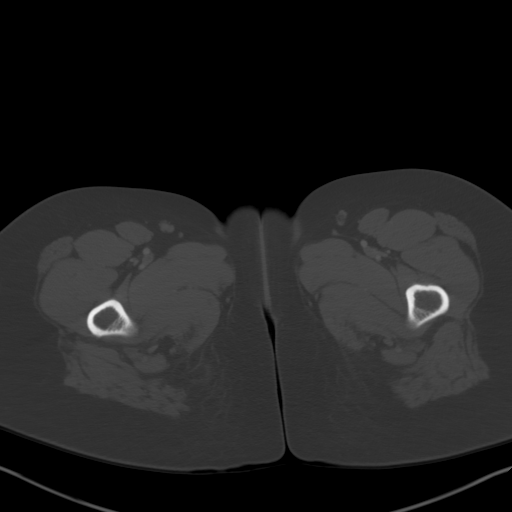
[im 11/96  soft-tissue]
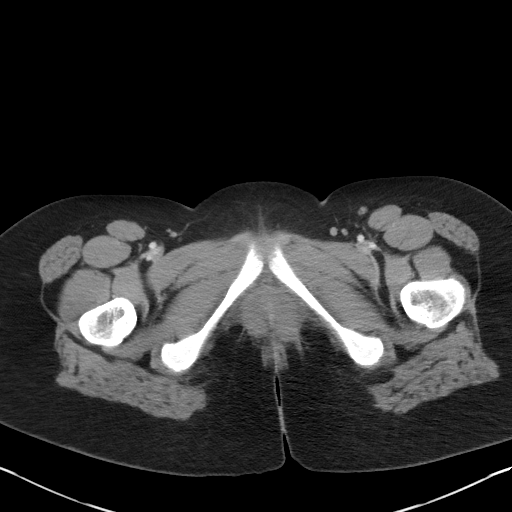
[im 22/96  soft-tissue]
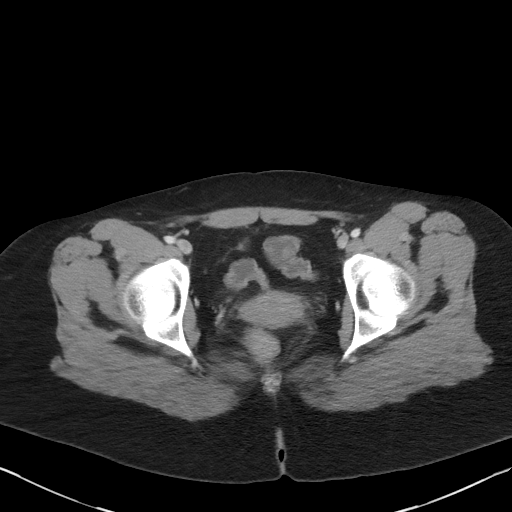
[im 27/96  soft-tissue]
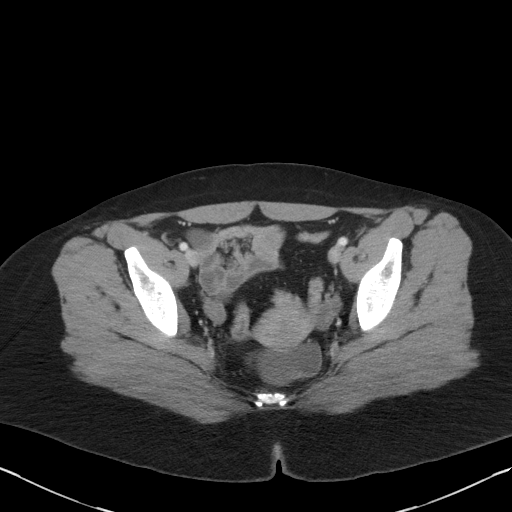
[im 32/96  soft-tissue]
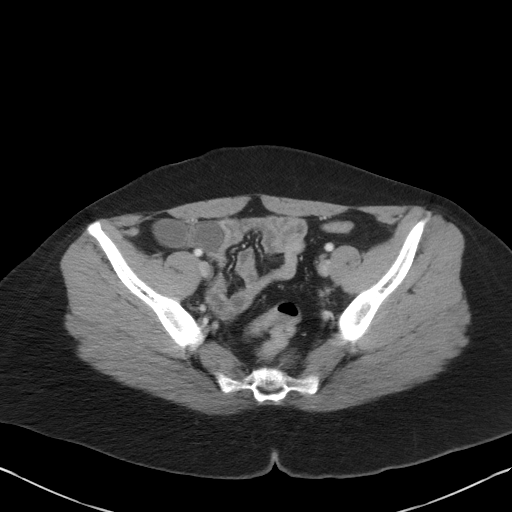
[im 43/96  soft-tissue]
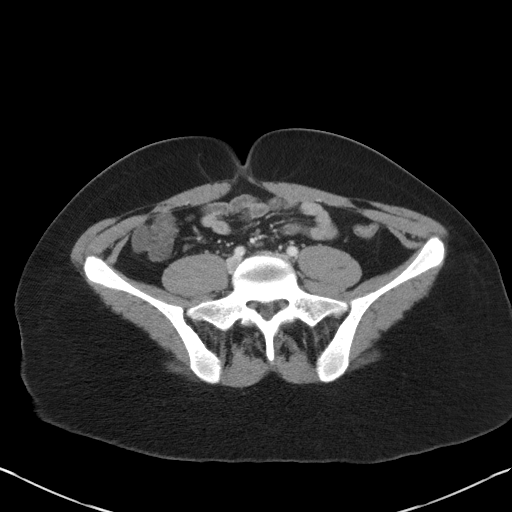
[im 48/96  soft-tissue]
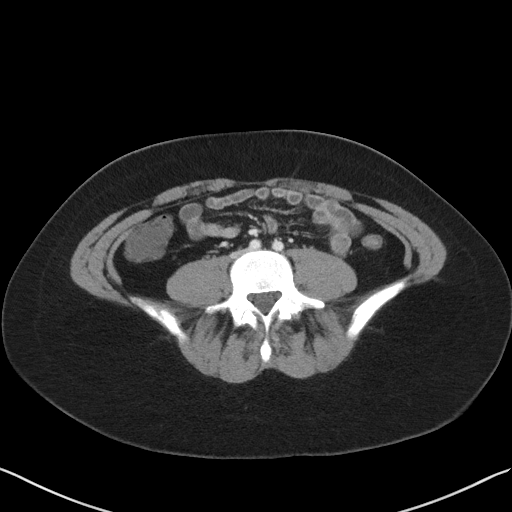
[im 53/96  soft-tissue]
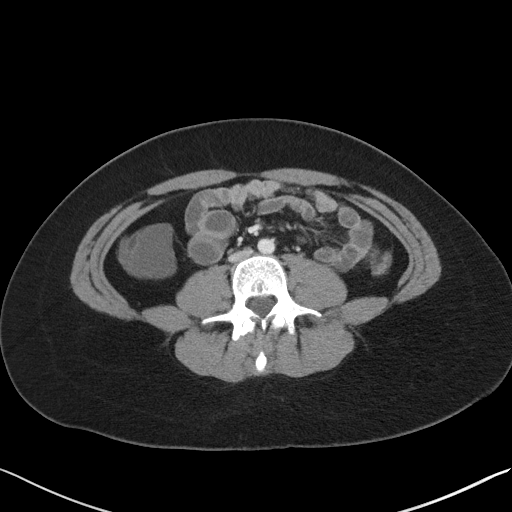
[im 64/96  soft-tissue]
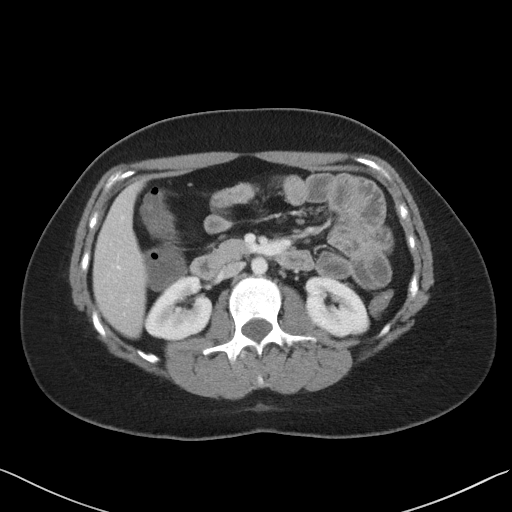
[im 64/96  bone]
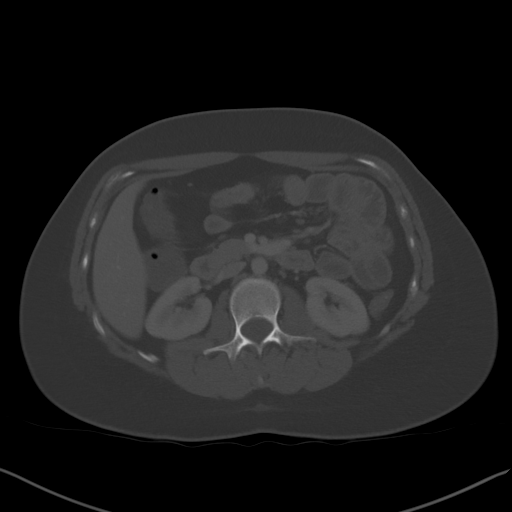
[im 69/96  soft-tissue]
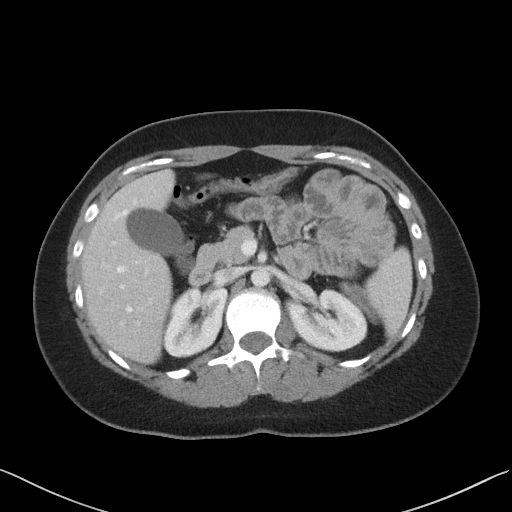
[im 74/96  soft-tissue]
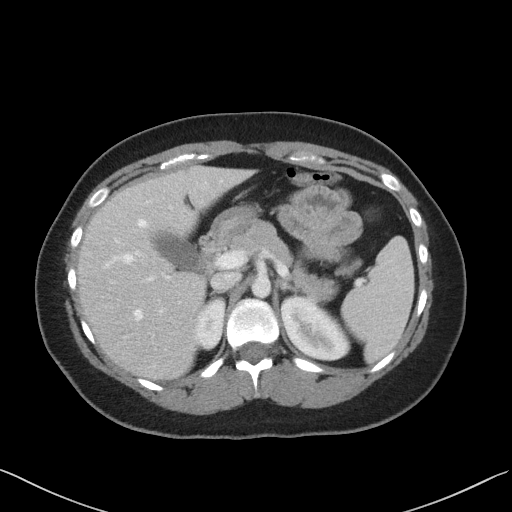
[im 85/96  soft-tissue]
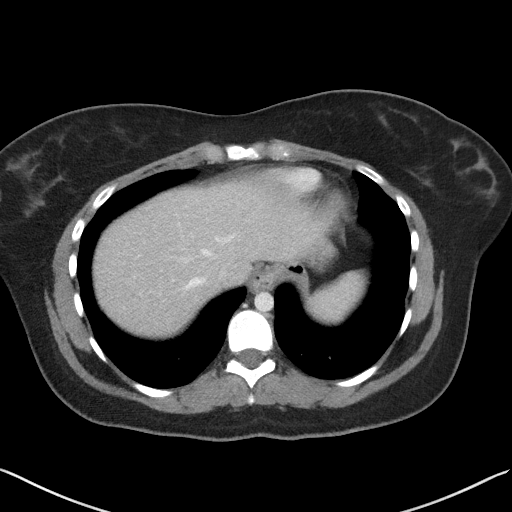
[im 90/96  soft-tissue]
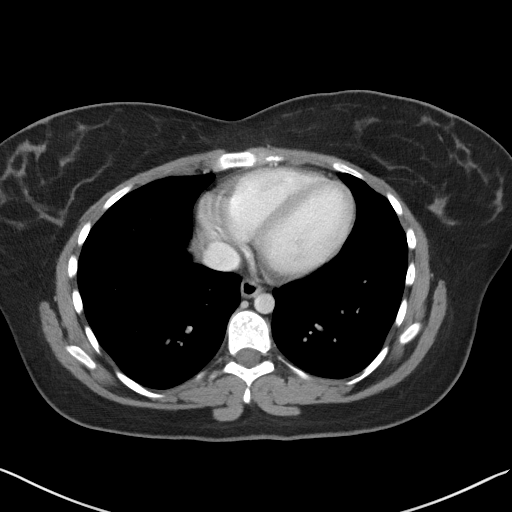

[Series 5: coronal st · coronal · 0.65mm/px · 3 of 96 slices shown]
[im 32/96  soft-tissue]
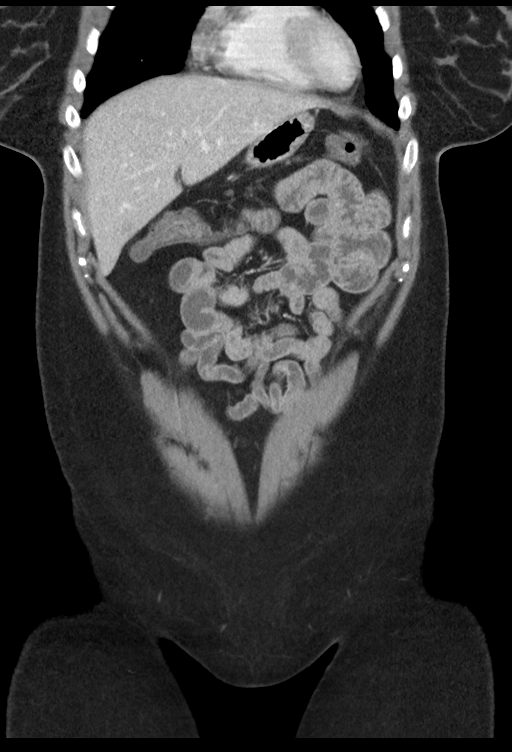
[im 43/96  soft-tissue]
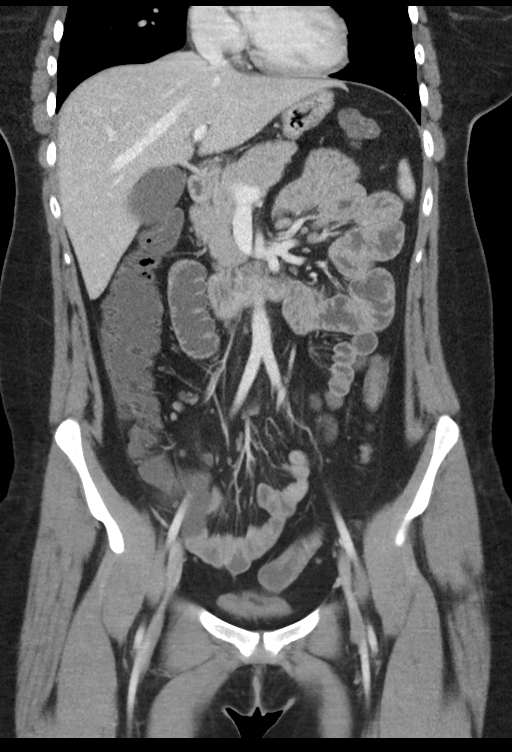
[im 53/96  soft-tissue]
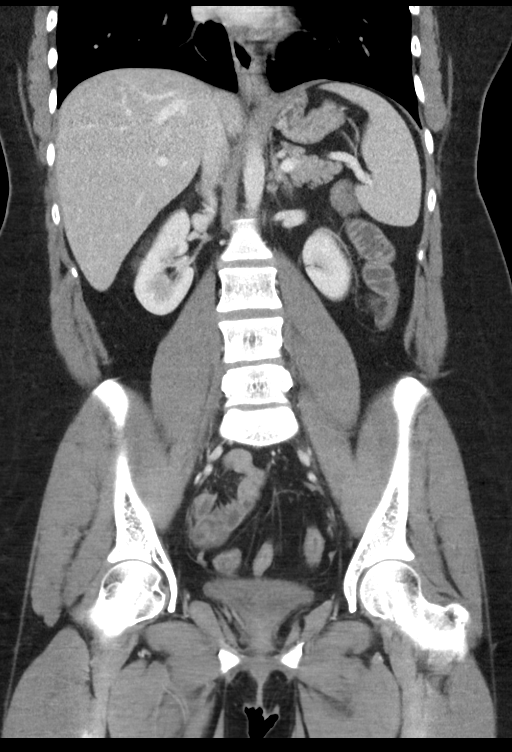

[16 of 46 positions shown; findings below may reference images not displayed]

FINDINGS: Lower chest: No acute abnormality.

Hepatobiliary: No focal liver abnormality is seen. No gallstones,
gallbladder wall thickening, or biliary dilatation.

Pancreas: Unremarkable. No pancreatic ductal dilatation or
surrounding inflammatory changes.

Spleen: Normal in size without focal abnormality.

Adrenals/Urinary Tract: Adrenal glands are unremarkable. Kidneys are
normal, without renal calculi, focal lesion, or hydronephrosis. The
urinary bladder is empty and subsequently limited in evaluation.

Stomach/Bowel: Stomach is within normal limits. Appendix appears
normal. No evidence of bowel wall thickening, distention, or
inflammatory changes.

Vascular/Lymphatic: No significant vascular findings are present. No
enlarged abdominal or pelvic lymph nodes.

Reproductive: Uterus and bilateral adnexa are unremarkable.

Other: No abdominal wall hernia or abnormality. No abdominopelvic
ascites.

Musculoskeletal: No acute or significant osseous findings.
IMPRESSION: No acute intra-abdominal findings.

## 2020-11-19 IMAGING — US US PELVIS COMPLETE TRANSABD/TRANSVAG W DUPLEX
1 series · 13 of 25 positions shown · non-contrast
Comparison: CT abdomen pelvis 06/14/2017

CLINICAL DATA: Mid to lower abdominal pain, patient on Depo-Provera

EXAM:
TRANSABDOMINAL AND TRANSVAGINAL ULTRASOUND OF PELVIS
DOPPLER ULTRASOUND OF OVARIES
TECHNIQUE: Both transabdominal and transvaginal ultrasound examinations of the
pelvis were performed. Transabdominal technique was performed for
global imaging of the pelvis including uterus, ovaries, adnexal
regions, and pelvic cul-de-sac.
It was necessary to proceed with endovaginal exam following the
transabdominal exam to visualize the right ovary. Color and duplex
Doppler ultrasound was utilized to evaluate blood flow to the
ovaries.

[Series 1: us pelvis complete transabd/transvag w duplex · 0.17mm/px · 13 of 145 slices shown]
[im 1/145]
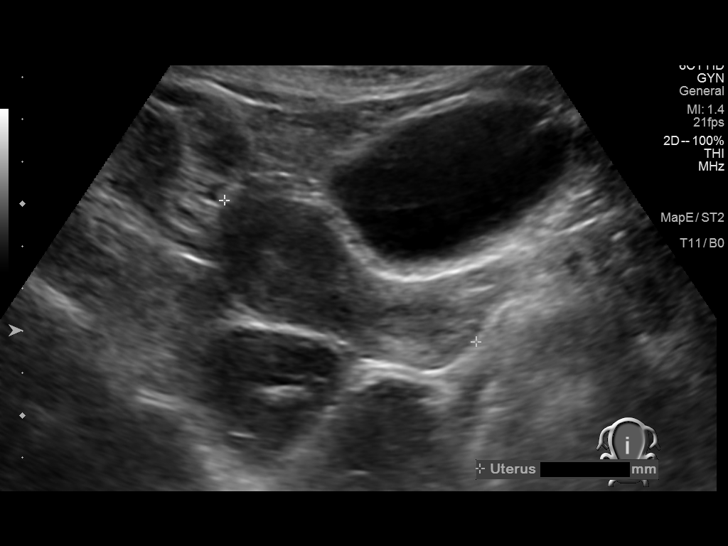
[im 13/145]
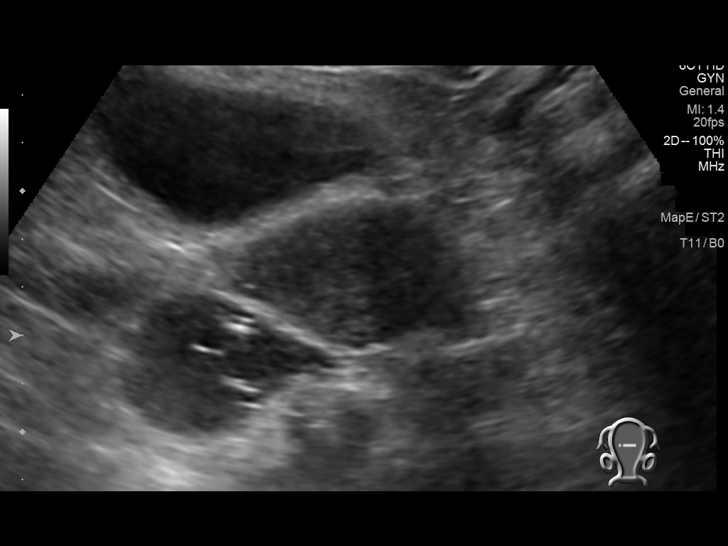
[im 25/145]
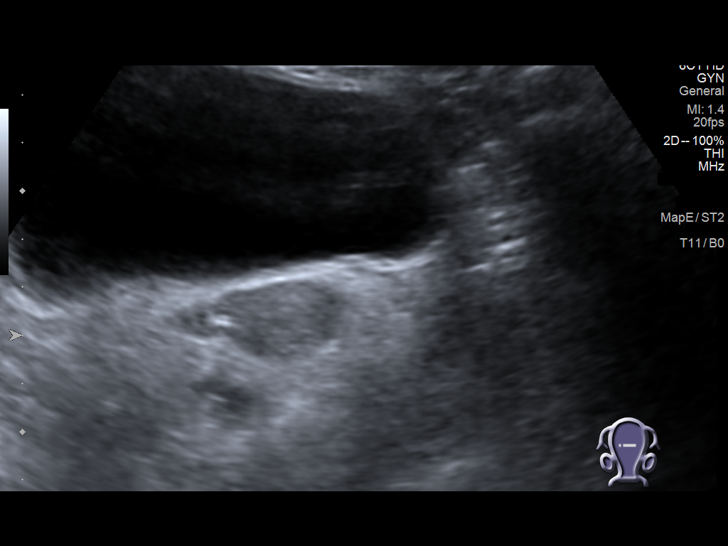
[im 37/145]
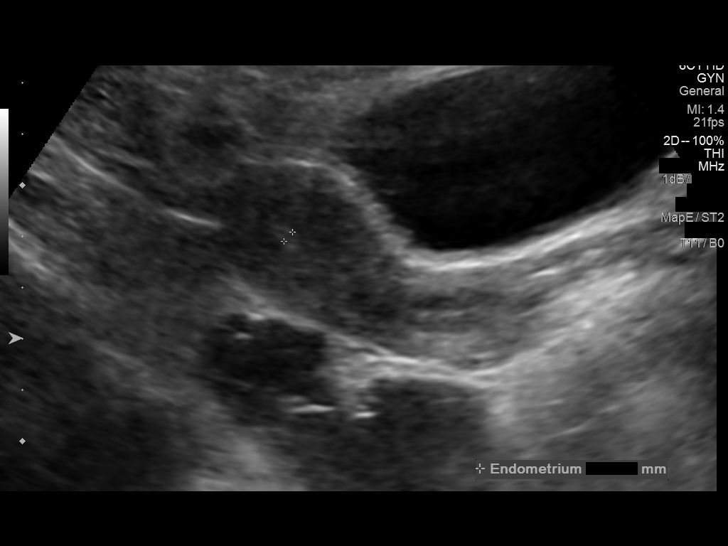
[im 49/145]
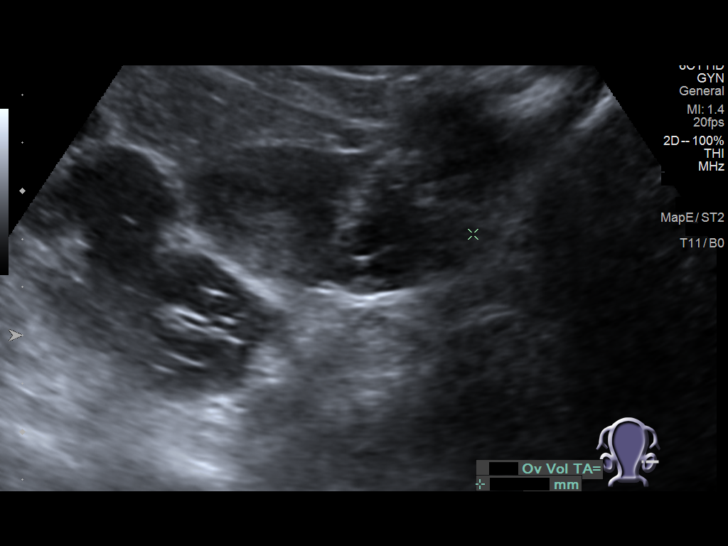
[im 61/145]
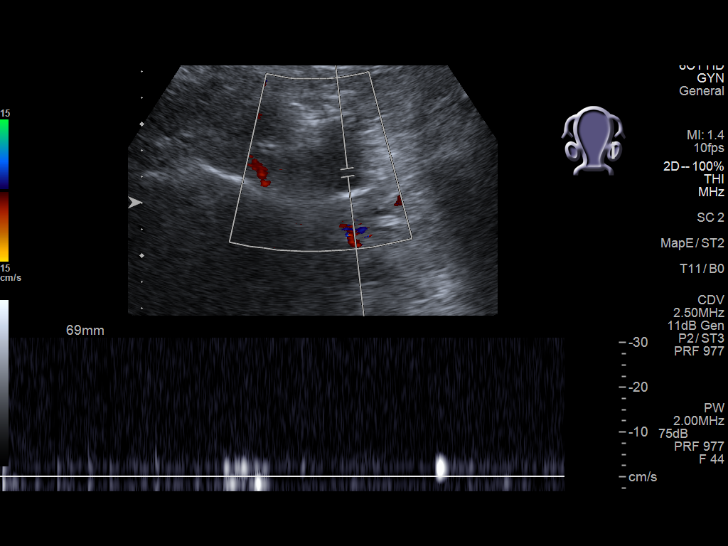
[im 73/145]
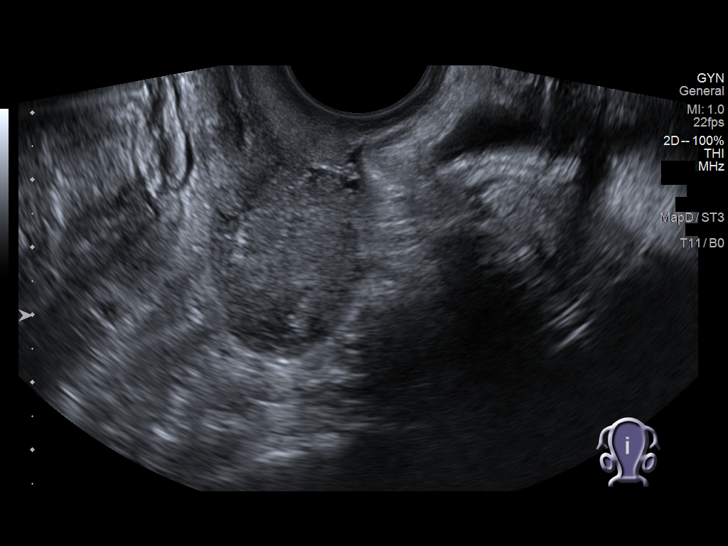
[im 85/145]
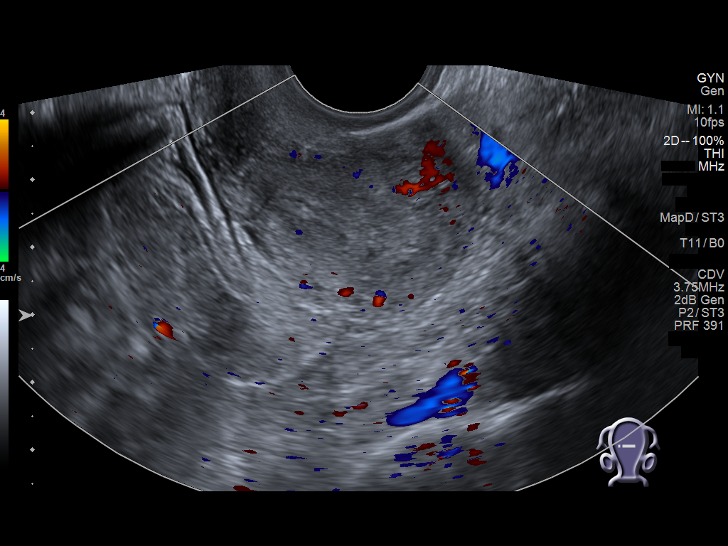
[im 97/145]
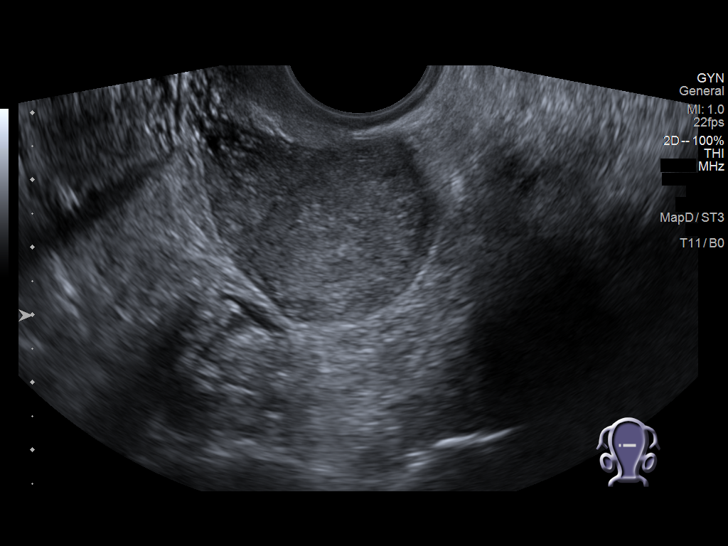
[im 109/145]
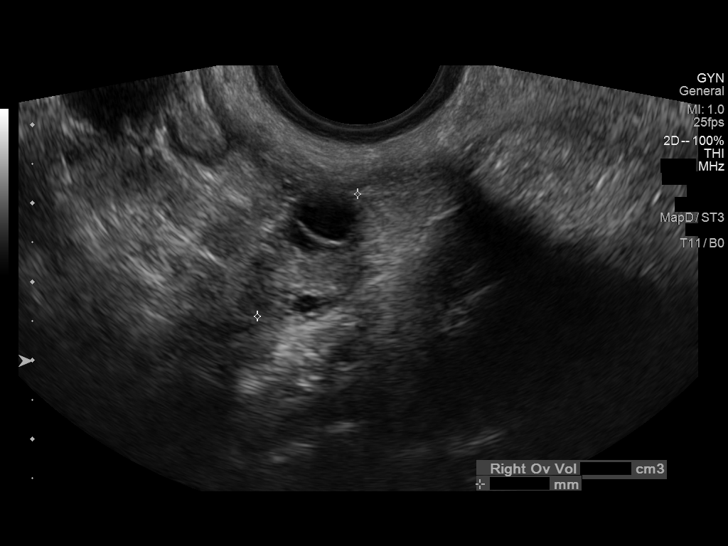
[im 121/145]
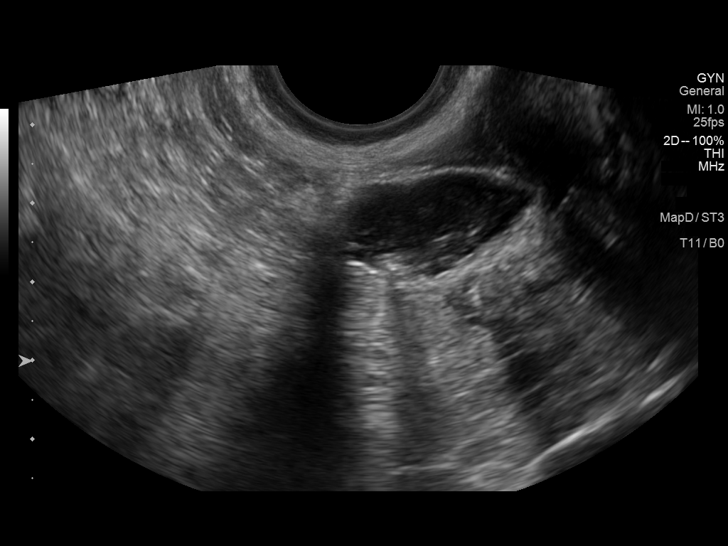
[im 133/145]
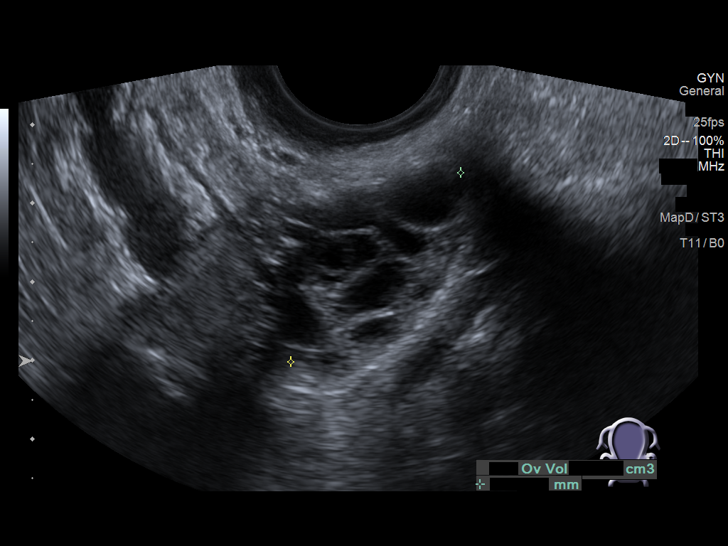
[im 145/145]
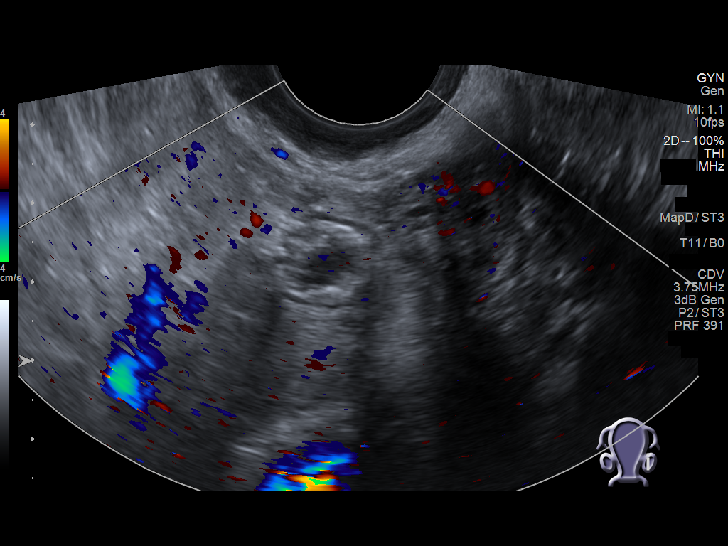

[13 of 25 positions shown; findings below may reference images not displayed]

FINDINGS: Uterus

Measurements: 5.9 x 3.2 x 4.2 cm = volume: 41 mL. Uterus is
retroverted. No fibroids or other uterine mass is identified.

Endometrium

Thickness: 2 mm, non thickened.  No focal abnormality visualized.

Right ovary

Measurements: 2.7 x 2.2 x 2.7 cm = volume: 9.1 mL. Normal
appearance/no adnexal mass.

Left ovary

Measurements: 3.3 x 1.7 x 3.3 cm = volume: 10 mL. Normal
appearance/no adnexal mass.

Pulsed Doppler evaluation of both ovaries demonstrates normal
low-resistance arterial and venous waveforms.

Other findings

No abnormal free fluid.
IMPRESSION: Unremarkable pelvic ultrasound.

## 2020-11-23 ENCOUNTER — Encounter: Payer: Self-pay | Admitting: Adult Health

## 2020-11-23 ENCOUNTER — Other Ambulatory Visit: Payer: Self-pay

## 2020-11-23 ENCOUNTER — Ambulatory Visit (INDEPENDENT_AMBULATORY_CARE_PROVIDER_SITE_OTHER): Payer: 59 | Admitting: Adult Health

## 2020-11-23 DIAGNOSIS — F410 Panic disorder [episodic paroxysmal anxiety] without agoraphobia: Secondary | ICD-10-CM | POA: Diagnosis not present

## 2020-11-23 DIAGNOSIS — F411 Generalized anxiety disorder: Secondary | ICD-10-CM | POA: Diagnosis not present

## 2020-11-23 MED ORDER — ALPRAZOLAM 0.5 MG PO TABS: 0.5000 mg | ORAL_TABLET | Freq: Three times a day (TID) | ORAL | 2 refills | Status: DC

## 2020-11-23 MED ORDER — SERTRALINE HCL 50 MG PO TABS: 50.0000 mg | ORAL_TABLET | Freq: Every day | ORAL | 5 refills | Status: DC

## 2020-11-23 NOTE — Progress Notes (Signed)
LESLIE DIMEO [id number] 20-Sep-2000 20 y.o.  Subjective:   Patient ID:  Krystal Peters is a 20 y.o. (DOB July 05, 2000) female.  Chief Complaint: No chief complaint on file.   HPI SHABRINA RENTAS presents to the office today for follow-up of anxiety and panic attacks.  Describes mood today as "ok". Pleasant. Mood symptoms - reports denies depression, irritability and anxiety. Tried increasing the Zoloft to '75mg'$  then to '100mg'$  and had GI issues. Decided to reduce dose back to '50mg'$  and is feeling better. Would like to continue at current dose for now. Reports panic attacks - 2 a week - using Xanax as needed. Recovered from recent surgery. Stable interest and motivation. Taking medications as prescribed.  Energy levels stable. Active, has a regular exercise routine. Enjoys some usual interests and activities. Single. Student. Lives with 2 room-mates. Mother in [place]. Father in [place]. Spending time with family. Appetite adequate. Weight stable.  Sleeps well most nights. Averages 7 to 8 hours. Focus and concentration stable. Completing tasks. Managing aspects of household. 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  Denies AH or VH.  Previous medication trials: Lexapro, Xanax    PHQ2-9    Flowsheet Row Office Visit from 01/10/2017 in [place] Visit from 10/04/2016 in [place] Visit from 09/15/2016 in [place] Visit from 06/07/2016 in [place] Visit from 12/30/2015 in [place]  PHQ-2 Total Score 0 0 0 0 0  PHQ-9 Total Score -- -- -- 0 0      Flowsheet Row Admission (Discharged) from 08/11/2020 in [facility] No Risk        Review of Systems:  Review of Systems  Musculoskeletal:  Negative for gait problem.  Neurological:  Negative for tremors.   Psychiatric/Behavioral:         Please refer to HPI   Medications: I have reviewed the patient's current medications.  Current Outpatient Medications  Medication Sig Dispense Refill   ALPRAZolam (XANAX) 0.5 MG tablet Take 1 tablet (0.5 mg total) by mouth 3 (three) times daily. 90 tablet 2   sertraline (ZOLOFT) 50 MG tablet Take 1 tablet (50 mg total) by mouth daily. 30 tablet 5   No current facility-administered medications for this visit.    Medication Side Effects: None  Allergies:  Allergies  Allergen Reactions   Oxycodone Hives and Rash   Shellfish Allergy Other (See Comments)    Unknown-not specifically determined Unknown-not specifically determined    Past Medical History:  Diagnosis Date   Anxiety    Cystitis    Depression    History of UTI    PONV (postoperative nausea and vomiting)     Past Medical History, Surgical history, Social history, and Family history were reviewed and updated as appropriate.   Please see review of systems for further details on the patient's review from today.   Objective:   Physical Exam:  There were no vitals taken for this visit.  Physical Exam Constitutional:      General: She is not in acute distress. Musculoskeletal:        General: No deformity.  Neurological:     Mental Status: She is alert and oriented to person, place, and time.     Coordination: Coordination normal.  Psychiatric:        Attention and Perception: Attention  and perception normal. She does not perceive auditory or visual hallucinations.        Mood and Affect: Mood normal. Mood is not anxious or depressed. Affect is not labile, blunt, angry or inappropriate.        Speech: Speech normal.        Behavior: Behavior normal.        Thought Content: Thought content normal. Thought content is not paranoid or delusional. Thought content does not include homicidal or suicidal ideation. Thought content does not include homicidal or suicidal plan.         Cognition and Memory: Cognition and memory normal.        Judgment: Judgment normal.     Comments: Insight intact    Lab Review:     Component Value Date/Time   NA 137 07/14/2019 1920   NA 140 01/10/2017 1819   K 3.5 07/14/2019 1920   CL 105 07/14/2019 1920   CO2 22 07/14/2019 1920   GLUCOSE 109 (H) 07/14/2019 1920   BUN 15 07/14/2019 1920   BUN 5 01/10/2017 1819   CREATININE 0.91 07/14/2019 1920   CALCIUM 10.0 07/14/2019 1920   PROT 8.8 (H) 07/14/2019 1920   PROT 7.0 01/10/2017 1819   ALBUMIN 5.4 (H) 07/14/2019 1920   ALBUMIN 4.3 01/10/2017 1819   AST 21 07/14/2019 1920   ALT 14 07/14/2019 1920   ALKPHOS 79 07/14/2019 1920   BILITOT 0.9 07/14/2019 1920   BILITOT <0.2 01/10/2017 1819   GFRNONAA >60 07/14/2019 1920   GFRAA >60 07/14/2019 1920       Component Value Date/Time   WBC 19.2 (H) 07/14/2019 1920   RBC 5.50 (H) 07/14/2019 1920   HGB 16.1 (H) 07/14/2019 1920   HGB 12.9 01/10/2017 1819   HCT 49.2 (H) 07/14/2019 1920   HCT 36.9 01/10/2017 1819   PLT 490 (H) 07/14/2019 1920   PLT 161 01/10/2017 1819   MCV 89.5 07/14/2019 1920   MCV 84 01/10/2017 1819   MCH 29.3 07/14/2019 1920   MCHC 32.7 07/14/2019 1920   RDW 12.0 07/14/2019 1920   RDW 13.7 01/10/2017 1819   LYMPHSABS 1.2 01/10/2017 1819   EOSABS 0.1 01/10/2017 1819   BASOSABS 0.0 01/10/2017 1819    No results found for: POCLITH, LITHIUM   No results found for: PHENYTOIN, PHENOBARB, VALPROATE, CBMZ   .res Assessment: Plan:    Plan:  1. Decrease Zoloft '50mg'$  to '100mg'$  daily  2. Xanax 0.'5mg'$  TID prn to scheduled for panic attacks - not using as much.  Time spent with patient was 25 minutes. Greater than 50% of face to face time with patient was spent on counseling and coordination of care.   RTC 3 months   Patient advised to contact office with any questions, adverse effects, or acute worsening in signs and symptoms.  Discussed potential benefits, risk, and side effects of benzodiazepines to  include potential risk of tolerance and dependence, as well as possible drowsiness.  Advised patient not to drive if experiencing drowsiness and to take lowest possible effective dose to minimize risk of dependence and tolerance.  Diagnoses and all orders for this visit:  Panic disorder -     sertraline (ZOLOFT) 50 MG tablet; Take 1 tablet (50 mg total) by mouth daily. -     ALPRAZolam (XANAX) 0.5 MG tablet; Take 1 tablet (0.5 mg total) by mouth 3 (three) times daily.  Generalized anxiety disorder -     sertraline (ZOLOFT) 50 MG tablet; Take 1  tablet (50 mg total) by mouth daily. -     ALPRAZolam (XANAX) 0.5 MG tablet; Take 1 tablet (0.5 mg total) by mouth 3 (three) times daily.    Please see After Visit Summary for patient specific instructions.  No future appointments.  No orders of the defined types were placed in this encounter.   -------------------------------

## 2021-02-23 ENCOUNTER — Ambulatory Visit: Payer: 59 | Admitting: Adult Health

## 2021-02-25 ENCOUNTER — Ambulatory Visit (INDEPENDENT_AMBULATORY_CARE_PROVIDER_SITE_OTHER): Payer: 59 | Admitting: Adult Health

## 2021-02-25 ENCOUNTER — Other Ambulatory Visit: Payer: Self-pay

## 2021-02-25 ENCOUNTER — Encounter: Payer: Self-pay | Admitting: Adult Health

## 2021-02-25 DIAGNOSIS — F32A Depression, unspecified: Secondary | ICD-10-CM

## 2021-02-25 DIAGNOSIS — F410 Panic disorder [episodic paroxysmal anxiety] without agoraphobia: Secondary | ICD-10-CM

## 2021-02-25 DIAGNOSIS — F411 Generalized anxiety disorder: Secondary | ICD-10-CM

## 2021-02-25 NOTE — Addendum Note (Signed)
Addended by: Aloha Gell on: 02/25/2021 11:25 AM   Modules accepted: Orders

## 2021-02-25 NOTE — Progress Notes (Addendum)
Krystal Peters [id number] Mar 26, 2001 20 y.o.  Subjective:   Patient ID:  Krystal Peters is a 20 y.o. (DOB 25-Nov-2000) female.  Chief Complaint: No chief complaint on file.   HPI Krystal Peters presents to the office today for follow-up of depression, anxiety and panic attacks.  Describes mood today as "ok". Pleasant. Mood symptoms - reports anxiety. Depression worse over past few months - "nothing I can't manage". Denies irritability. Reports 2 recent panic attacks. Stating "I do feel like I manage them better". Has decreased Zoloft to 25mg  daily and would like to stop it. Stating "I can't tolerate the GI issues any more". Willing to try another medication. Stable interest and motivation. Taking medications as prescribed.  Energy levels stable. Active, has a regular exercise routine. Enjoys some usual interests and activities. Single. Student. Lives with 2 room-mates. Mother in [place]. Father in [place]. Spending time with family. Appetite adequate. Weight loss from GI issues - 163 pounds  Sleeps well most nights. Averages 7 to 8 hours. Focus and concentration stable. Completing tasks. Managing aspects of household. 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 Has finished her pre-requisites and thinking about options Denies SI or HI.  Denies AH or VH.  Previous medication trials: Lexapro, Xanax    PHQ2-9    Flowsheet Row Office Visit from 01/10/2017 in [facility] Visit from 10/04/2016 in [facility] Visit from 09/15/2016 in [facility] Visit from 06/07/2016 in [facility] Visit from 12/30/2015 in [facility]  PHQ-2 Total Score 0 0 0 0 0  PHQ-9 Total Score -- -- -- 0 0      Flowsheet Row Admission (Discharged) from 08/11/2020 in [facility] No Risk        Review of Systems:  Review of Systems   Musculoskeletal:  Negative for gait problem.  Neurological:  Negative for tremors.  Psychiatric/Behavioral:         Please refer to HPI   Medications: I have reviewed the patient's current medications.  Current Outpatient Medications  Medication Sig Dispense Refill   ALPRAZolam (XANAX) 0.5 MG tablet Take 1 tablet (0.5 mg total) by mouth 3 (three) times daily. 90 tablet 2   No current facility-administered medications for this visit.    Medication Side Effects: None  Allergies:  Allergies  Allergen Reactions   Oxycodone Hives and Rash   Shellfish Allergy Other (See Comments)    Unknown-not specifically determined Unknown-not specifically determined    Past Medical History:  Diagnosis Date   Anxiety    Cystitis    Depression    History of UTI    PONV (postoperative nausea and vomiting)     Past Medical History, Surgical history, Social history, and Family history were reviewed and updated as appropriate.   Please see review of systems for further details on the patient's review from today.   Objective:   Physical Exam:  There were no vitals taken for this visit.  Physical Exam Constitutional:      General: She is not in acute distress. Musculoskeletal:        General: No deformity.  Neurological:     Mental Status: She is alert and oriented to person, place, and time.     Coordination: Coordination normal.  Psychiatric:        Attention and Perception: Attention and perception normal. She does not  perceive auditory or visual hallucinations.        Mood and Affect: Mood normal. Mood is not anxious or depressed. Affect is not labile, blunt, angry or inappropriate.        Speech: Speech normal.        Behavior: Behavior normal.        Thought Content: Thought content normal. Thought content is not paranoid or delusional. Thought content does not include homicidal or suicidal ideation. Thought content does not include homicidal or suicidal plan.        Cognition  and Memory: Cognition and memory normal.        Judgment: Judgment normal.     Comments: Insight intact    Lab Review:     Component Value Date/Time   NA 137 07/14/2019 1920   NA 140 01/10/2017 1819   K 3.5 07/14/2019 1920   CL 105 07/14/2019 1920   CO2 22 07/14/2019 1920   GLUCOSE 109 (H) 07/14/2019 1920   BUN 15 07/14/2019 1920   BUN 5 01/10/2017 1819   CREATININE 0.91 07/14/2019 1920   CALCIUM 10.0 07/14/2019 1920   PROT 8.8 (H) 07/14/2019 1920   PROT 7.0 01/10/2017 1819   ALBUMIN 5.4 (H) 07/14/2019 1920   ALBUMIN 4.3 01/10/2017 1819   AST 21 07/14/2019 1920   ALT 14 07/14/2019 1920   ALKPHOS 79 07/14/2019 1920   BILITOT 0.9 07/14/2019 1920   BILITOT <0.2 01/10/2017 1819   GFRNONAA >60 07/14/2019 1920   GFRAA >60 07/14/2019 1920       Component Value Date/Time   WBC 19.2 (H) 07/14/2019 1920   RBC 5.50 (H) 07/14/2019 1920   HGB 16.1 (H) 07/14/2019 1920   HGB 12.9 01/10/2017 1819   HCT 49.2 (H) 07/14/2019 1920   HCT 36.9 01/10/2017 1819   PLT 490 (H) 07/14/2019 1920   PLT 161 01/10/2017 1819   MCV 89.5 07/14/2019 1920   MCV 84 01/10/2017 1819   MCH 29.3 07/14/2019 1920   MCHC 32.7 07/14/2019 1920   RDW 12.0 07/14/2019 1920   RDW 13.7 01/10/2017 1819   LYMPHSABS 1.2 01/10/2017 1819   EOSABS 0.1 01/10/2017 1819   BASOSABS 0.0 01/10/2017 1819    No results found for: POCLITH, LITHIUM   No results found for: PHENYTOIN, PHENOBARB, VALPROATE, CBMZ   .res Assessment: Plan:     Plan:  1. D/C Zoloft 25mg  daily 2. Xanax 0.5mg  TID prn to scheduled for panic attacks - not using as much.  Discussed Gene Sight testing and will uninitiate testing.  Time spent with patient was 25 minutes. Greater than 50% of face to face time with patient was spent on counseling and coordination of care.   RTC 3 months   Patient advised to contact office with any questions, adverse effects, or acute worsening in signs and symptoms.  Discussed potential benefits, risk, and  side effects of benzodiazepines to include potential risk of tolerance and dependence, as well as possible drowsiness.  Advised patient not to drive if experiencing drowsiness and to take lowest possible effective dose to minimize risk of dependence and tolerance.  Diagnoses and all orders for this visit:  Panic disorder  Generalized anxiety disorder  Depressive disorder    Please see After Visit Summary for patient specific instructions.  No future appointments.  No orders of the defined types were placed in this encounter.   -------------------------------

## 2021-02-26 DIAGNOSIS — H5213 Myopia, bilateral: Secondary | ICD-10-CM | POA: Diagnosis not present

## 2021-03-11 ENCOUNTER — Ambulatory Visit: Payer: 59 | Admitting: Adult Health

## 2021-03-11 NOTE — Progress Notes (Signed)
Patient no show appointment. ? ?

## 2021-03-17 ENCOUNTER — Other Ambulatory Visit (HOSPITAL_COMMUNITY): Payer: Self-pay

## 2021-03-17 ENCOUNTER — Encounter: Payer: Self-pay | Admitting: Adult Health

## 2021-03-17 ENCOUNTER — Other Ambulatory Visit: Payer: Self-pay

## 2021-03-17 ENCOUNTER — Ambulatory Visit (INDEPENDENT_AMBULATORY_CARE_PROVIDER_SITE_OTHER): Payer: 59 | Admitting: Adult Health

## 2021-03-17 DIAGNOSIS — F411 Generalized anxiety disorder: Secondary | ICD-10-CM

## 2021-03-17 DIAGNOSIS — F410 Panic disorder [episodic paroxysmal anxiety] without agoraphobia: Secondary | ICD-10-CM | POA: Diagnosis not present

## 2021-03-17 DIAGNOSIS — F325 Major depressive disorder, single episode, in full remission: Secondary | ICD-10-CM | POA: Diagnosis not present

## 2021-03-17 MED ORDER — ALPRAZOLAM 0.5 MG PO TABS
0.5000 mg | ORAL_TABLET | Freq: Three times a day (TID) | ORAL | 2 refills | Status: DC
  Filled 2021-03-17: qty 90, 30d supply, fill #0
  Filled 2021-04-14: qty 90, 30d supply, fill #1

## 2021-03-17 MED ORDER — DESVENLAFAXINE SUCCINATE ER 50 MG PO TB24
50.0000 mg | ORAL_TABLET | Freq: Every day | ORAL | 5 refills | Status: DC
  Filled 2021-03-17: qty 30, 30d supply, fill #0

## 2021-03-17 MED ORDER — DESVENLAFAXINE SUCCINATE ER 25 MG PO TB24
ORAL_TABLET | ORAL | 0 refills | Status: DC
  Filled 2021-03-17: qty 7, 7d supply, fill #0

## 2021-03-17 NOTE — Progress Notes (Signed)
Krystal Peters [id number] 09-20-2000 20 y.o.  Subjective:   Patient ID:  Krystal Peters is a 20 y.o. (DOB 2000-05-30) female.  Chief Complaint: No chief complaint on file.   HPI HEELA HEISHMAN presents to the office today for follow-up of depression, anxiety and panic attacks.  Describes mood today as "not so good". Pleasant. Mood symptoms - reports anxiety - "eels anxious as soon as I wake up" and depression - "I feel like the anxiety triggers it". Irritable at times  - snappy with family and friends. Reports panic attacks. Stating "It's been hard for me lately". Decreased interest and motivation. Taking medications as prescribed.  Energy levels stable. Active, has a regular exercise routine. Enjoys some usual interests and activities. Single. Student. Lives with 2 room-mates. Mother in [place]. Father in [place]. Spending time with family. Appetite adequate. Weight loss from GI issues 8 pounds - 159 from 167 pounds. Stating "I have a nervous stomach".  Sleeps well most nights. Averages 7 to 8 hours. Focus and concentration stable. Completing tasks. Managing aspects of household. 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 Has finished her pre-requisites and thinking about options Denies SI or HI.  Denies AH or VH.  Previous medication trials: Lexapro, Xanax    PHQ2-9    Flowsheet Row Office Visit from 01/10/2017 in [facility] Visit from 10/04/2016 in [facility] Visit from 09/15/2016 in [facility] Visit from 06/07/2016 in [facility] Visit from 12/30/2015 in [facility]  PHQ-2 Total Score 0 0 0 0 0  PHQ-9 Total Score -- -- -- 0 0      Flowsheet Row Admission (Discharged) from 08/11/2020 in [facility] No Risk        Review of Systems:  Review of Systems  Musculoskeletal:  Negative for gait problem.   Neurological:  Negative for tremors.  Psychiatric/Behavioral:         Please refer to HPI   Medications: I have reviewed the patient's current medications.  Current Outpatient Medications  Medication Sig Dispense Refill   ALPRAZolam (XANAX) 0.5 MG tablet Take 1 tablet (0.5 mg total) by mouth 3 (three) times daily. 90 tablet 2   No current facility-administered medications for this visit.    Medication Side Effects: None  Allergies:  Allergies  Allergen Reactions   Oxycodone Hives and Rash   Shellfish Allergy Other (See Comments)    Unknown-not specifically determined Unknown-not specifically determined    Past Medical History:  Diagnosis Date   Anxiety    Cystitis    Depression    History of UTI    PONV (postoperative nausea and vomiting)     Past Medical History, Surgical history, Social history, and Family history were reviewed and updated as appropriate.   Please see review of systems for further details on the patient's review from today.   Objective:   Physical Exam:  There were no vitals taken for this visit.  Physical Exam Constitutional:      General: She is not in acute distress. Musculoskeletal:        General: No deformity.  Neurological:     Mental Status: She is alert and oriented to person, place, and time.     Coordination: Coordination normal.  Psychiatric:        Attention and Perception: Attention and perception normal. She does not perceive auditory  or visual hallucinations.        Mood and Affect: Mood normal. Mood is not anxious or depressed. Affect is not labile, blunt, angry or inappropriate.        Speech: Speech normal.        Behavior: Behavior normal.        Thought Content: Thought content normal. Thought content is not paranoid or delusional. Thought content does not include homicidal or suicidal ideation. Thought content does not include homicidal or suicidal plan.        Cognition and Memory: Cognition and memory normal.         Judgment: Judgment normal.     Comments: Insight intact    Lab Review:     Component Value Date/Time   NA 137 07/14/2019 1920   NA 140 01/10/2017 1819   K 3.5 07/14/2019 1920   CL 105 07/14/2019 1920   CO2 22 07/14/2019 1920   GLUCOSE 109 (H) 07/14/2019 1920   BUN 15 07/14/2019 1920   BUN 5 01/10/2017 1819   CREATININE 0.91 07/14/2019 1920   CALCIUM 10.0 07/14/2019 1920   PROT 8.8 (H) 07/14/2019 1920   PROT 7.0 01/10/2017 1819   ALBUMIN 5.4 (H) 07/14/2019 1920   ALBUMIN 4.3 01/10/2017 1819   AST 21 07/14/2019 1920   ALT 14 07/14/2019 1920   ALKPHOS 79 07/14/2019 1920   BILITOT 0.9 07/14/2019 1920   BILITOT <0.2 01/10/2017 1819   GFRNONAA >60 07/14/2019 1920   GFRAA >60 07/14/2019 1920       Component Value Date/Time   WBC 19.2 (H) 07/14/2019 1920   RBC 5.50 (H) 07/14/2019 1920   HGB 16.1 (H) 07/14/2019 1920   HGB 12.9 01/10/2017 1819   HCT 49.2 (H) 07/14/2019 1920   HCT 36.9 01/10/2017 1819   PLT 490 (H) 07/14/2019 1920   PLT 161 01/10/2017 1819   MCV 89.5 07/14/2019 1920   MCV 84 01/10/2017 1819   MCH 29.3 07/14/2019 1920   MCHC 32.7 07/14/2019 1920   RDW 12.0 07/14/2019 1920   RDW 13.7 01/10/2017 1819   LYMPHSABS 1.2 01/10/2017 1819   EOSABS 0.1 01/10/2017 1819   BASOSABS 0.0 01/10/2017 1819    No results found for: POCLITH, LITHIUM   No results found for: PHENYTOIN, PHENOBARB, VALPROATE, CBMZ   .res Assessment: Plan:    Plan:  1. D/C Zoloft 25mg  daily 2. Xanax 0.5mg  TID prn to scheduled for panic attacks - not using as much. 3. Add Pristiq 25mg  every am for 7 days, then increase 50mg  daily.  Discussed Gene Sight testing reslts  Time spent with patient was 25 minutes. Greater than 50% of face to face time with patient was spent on counseling and coordination of care.   RTC 3 months   Patient advised to contact office with any questions, adverse effects, or acute worsening in signs and symptoms.  Discussed potential benefits, risk, and side  effects of benzodiazepines to include potential risk of tolerance and dependence, as well as possible drowsiness.  Advised patient not to drive if experiencing drowsiness and to take lowest possible effective dose to minimize risk of dependence and tolerance.   There are no diagnoses linked to this encounter.   Please see After Visit Summary for patient specific instructions.  No future appointments.  No orders of the defined types were placed in this encounter.   -------------------------------

## 2021-04-14 ENCOUNTER — Other Ambulatory Visit: Payer: Self-pay

## 2021-04-14 ENCOUNTER — Encounter: Payer: Self-pay | Admitting: Adult Health

## 2021-04-14 ENCOUNTER — Other Ambulatory Visit (HOSPITAL_COMMUNITY): Payer: Self-pay

## 2021-04-14 ENCOUNTER — Ambulatory Visit (INDEPENDENT_AMBULATORY_CARE_PROVIDER_SITE_OTHER): Payer: 59 | Admitting: Adult Health

## 2021-04-14 DIAGNOSIS — F325 Major depressive disorder, single episode, in full remission: Secondary | ICD-10-CM

## 2021-04-14 DIAGNOSIS — F411 Generalized anxiety disorder: Secondary | ICD-10-CM | POA: Diagnosis not present

## 2021-04-14 DIAGNOSIS — F410 Panic disorder [episodic paroxysmal anxiety] without agoraphobia: Secondary | ICD-10-CM | POA: Diagnosis not present

## 2021-04-14 MED ORDER — DESVENLAFAXINE SUCCINATE ER 50 MG PO TB24
50.0000 mg | ORAL_TABLET | Freq: Every day | ORAL | 5 refills | Status: DC
Start: 2021-04-14 — End: 2021-10-15
  Filled 2021-04-14 – 2021-04-30 (×2): qty 30, 30d supply, fill #0
  Filled 2021-06-16: qty 30, 30d supply, fill #1
  Filled 2021-07-24: qty 30, 30d supply, fill #2
  Filled 2021-08-28: qty 30, 30d supply, fill #3

## 2021-04-14 MED ORDER — ALPRAZOLAM 0.5 MG PO TABS
0.5000 mg | ORAL_TABLET | Freq: Three times a day (TID) | ORAL | 2 refills | Status: DC
  Filled 2021-05-25: qty 90, 30d supply, fill #0
  Filled 2021-06-24: qty 90, 30d supply, fill #1
  Filled 2021-08-06: qty 90, 30d supply, fill #2

## 2021-04-14 NOTE — Progress Notes (Signed)
Krystal Peters [id number] 02-15-2001 20 y.o.  Subjective:   Patient ID:  Krystal Peters is a 20 y.o. (DOB April 02, 2001) female.  Chief Complaint: No chief complaint on file.   HPI Krystal Peters presents to the office today for follow-up of depression, anxiety and panic attacks.  Describes mood today as better". Pleasant. Mood symptoms - reports decreased anxiety. Denies depression and irritability. Reports decreased panic attacks - same intensity, not lasting as long, and not crashing after having one.  Mood more consistent. Feels like addition of Pristiq at 50mg  has been helpful. Improved interest and motivation. Taking medications as prescribed. Energy levels stable. Active, has a regular exercise routine. Enjoys some usual interests and activities. Single. Student. Has a boyfriend.Lives with 2 room-mates. Mother in [place]. Father in [place]. Spending time with family. Appetite adequate. Weight loss - 159 pounds. Sleeps well most nights. Averages 7 to 8 hours. Focus and concentration stable - "a little better". Completing tasks. Managing aspects of household. 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 Has finished her pre-requisites and thinking about options Denies SI or HI.  Denies AH or VH.  Previous medication trials: Lexapro, Xanax   PHQ2-9    Flowsheet Row Office Visit from 01/10/2017 in [facility] Visit from 10/04/2016 in [facility] Visit from 09/15/2016 in [facility] Visit from 06/07/2016 in [facility] Visit from 12/30/2015 in [facility]  PHQ-2 Total Score 0 0 0 0 0  PHQ-9 Total Score -- -- -- 0 0      Flowsheet Row Admission (Discharged) from 08/11/2020 in [facility] No Risk        Review of Systems:  Review of Systems  Musculoskeletal:  Negative for gait problem.  Neurological:   Negative for tremors.  Psychiatric/Behavioral:         Please refer to HPI   Medications: I have reviewed the patient's current medications.  Current Outpatient Medications  Medication Sig Dispense Refill   ALPRAZolam (XANAX) 0.5 MG tablet Take 1 tablet (0.5 mg total) by mouth 3 (three) times daily. 90 tablet 2   desvenlafaxine (PRISTIQ) 50 MG 24 hr tablet Take 1 tablet (50 mg total) by mouth daily. 30 tablet 5   No current facility-administered medications for this visit.    Medication Side Effects: None  Allergies:  Allergies  Allergen Reactions   Oxycodone Hives and Rash   Shellfish Allergy Other (See Comments)    Unknown-not specifically determined Unknown-not specifically determined    Past Medical History:  Diagnosis Date   Anxiety    Cystitis    Depression    History of UTI    PONV (postoperative nausea and vomiting)     Past Medical History, Surgical history, Social history, and Family history were reviewed and updated as appropriate.   Please see review of systems for further details on the patient's review from today.   Objective:   Physical Exam:  There were no vitals taken for this visit.  Physical Exam Constitutional:      General: She is not in acute distress. Musculoskeletal:        General: No deformity.  Neurological:     Mental Status: She is alert and oriented to person, place, and time.     Coordination: Coordination normal.  Psychiatric:        Attention and Perception: Attention and  perception normal. She does not perceive auditory or visual hallucinations.        Mood and Affect: Mood normal. Mood is not anxious or depressed. Affect is not labile, blunt, angry or inappropriate.        Speech: Speech normal.        Behavior: Behavior normal.        Thought Content: Thought content normal. Thought content is not paranoid or delusional. Thought content does not include homicidal or suicidal ideation. Thought content does not include  homicidal or suicidal plan.        Cognition and Memory: Cognition and memory normal.        Judgment: Judgment normal.     Comments: Insight intact    Lab Review:     Component Value Date/Time   NA 137 07/14/2019 1920   NA 140 01/10/2017 1819   K 3.5 07/14/2019 1920   CL 105 07/14/2019 1920   CO2 22 07/14/2019 1920   GLUCOSE 109 (H) 07/14/2019 1920   BUN 15 07/14/2019 1920   BUN 5 01/10/2017 1819   CREATININE 0.91 07/14/2019 1920   CALCIUM 10.0 07/14/2019 1920   PROT 8.8 (H) 07/14/2019 1920   PROT 7.0 01/10/2017 1819   ALBUMIN 5.4 (H) 07/14/2019 1920   ALBUMIN 4.3 01/10/2017 1819   AST 21 07/14/2019 1920   ALT 14 07/14/2019 1920   ALKPHOS 79 07/14/2019 1920   BILITOT 0.9 07/14/2019 1920   BILITOT <0.2 01/10/2017 1819   GFRNONAA >60 07/14/2019 1920   GFRAA >60 07/14/2019 1920       Component Value Date/Time   WBC 19.2 (H) 07/14/2019 1920   RBC 5.50 (H) 07/14/2019 1920   HGB 16.1 (H) 07/14/2019 1920   HGB 12.9 01/10/2017 1819   HCT 49.2 (H) 07/14/2019 1920   HCT 36.9 01/10/2017 1819   PLT 490 (H) 07/14/2019 1920   PLT 161 01/10/2017 1819   MCV 89.5 07/14/2019 1920   MCV 84 01/10/2017 1819   MCH 29.3 07/14/2019 1920   MCHC 32.7 07/14/2019 1920   RDW 12.0 07/14/2019 1920   RDW 13.7 01/10/2017 1819   LYMPHSABS 1.2 01/10/2017 1819   EOSABS 0.1 01/10/2017 1819   BASOSABS 0.0 01/10/2017 1819    No results found for: POCLITH, LITHIUM   No results found for: PHENYTOIN, PHENOBARB, VALPROATE, CBMZ   .res Assessment: Plan:    Plan:  Xanax 0.5mg  TID prn to scheduled for panic attacks - not using as much. Pristiq 50mg  daily.  Discussed Gene Sight testing reslts   RTC 3 months   Patient advised to contact office with any questions, adverse effects, or acute worsening in signs and symptoms.  Discussed potential benefits, risk, and side effects of benzodiazepines to include potential risk of tolerance and dependence, as well as possible drowsiness.  Advised  patient not to drive if experiencing drowsiness and to take lowest possible effective dose to minimize risk of dependence and tolerance.  Diagnoses and all orders for this visit:  Panic disorder -     ALPRAZolam (XANAX) 0.5 MG tablet; Take 1 tablet (0.5 mg total) by mouth 3 (three) times daily.  Generalized anxiety disorder -     desvenlafaxine (PRISTIQ) 50 MG 24 hr tablet; Take 1 tablet (50 mg total) by mouth daily. -     ALPRAZolam (XANAX) 0.5 MG tablet; Take 1 tablet (0.5 mg total) by mouth 3 (three) times daily.  Major depression, single episode, in complete remission ([place])    Please see After  Visit Summary for patient specific instructions.  No future appointments.  No orders of the defined types were placed in this encounter.   -------------------------------

## 2021-04-22 ENCOUNTER — Other Ambulatory Visit (HOSPITAL_COMMUNITY): Payer: Self-pay

## 2021-04-30 ENCOUNTER — Other Ambulatory Visit (HOSPITAL_COMMUNITY): Payer: Self-pay

## 2021-05-12 ENCOUNTER — Ambulatory Visit (INDEPENDENT_AMBULATORY_CARE_PROVIDER_SITE_OTHER): Payer: 59 | Admitting: Adult Health

## 2021-05-12 ENCOUNTER — Other Ambulatory Visit: Payer: Self-pay

## 2021-05-12 ENCOUNTER — Encounter: Payer: Self-pay | Admitting: Adult Health

## 2021-05-12 DIAGNOSIS — F411 Generalized anxiety disorder: Secondary | ICD-10-CM

## 2021-05-12 DIAGNOSIS — F325 Major depressive disorder, single episode, in full remission: Secondary | ICD-10-CM

## 2021-05-12 DIAGNOSIS — F410 Panic disorder [episodic paroxysmal anxiety] without agoraphobia: Secondary | ICD-10-CM | POA: Diagnosis not present

## 2021-05-12 NOTE — Progress Notes (Signed)
[name] [id number] Sep 15, 2000 21 y.o.  Subjective:   Patient ID:  Krystal Peters is a 21 y.o. (DOB [birth year]) female.  Chief Complaint: No chief complaint on file.   HPI Krystal Peters presents to the office today for follow-up of depression, anxiety and panic attacks.  Describes mood today as better". Pleasant. Mood symptoms - reports decreased anxiety - "still there - more at [place]". Denies depression and irritability. Reports some panic attacks - "nothing strong". Mood more regulated - no longer having "highs or lows". Feels like Pristiq at 50mg  has been helpful. Stable interest and motivation. Taking medications as prescribed. Energy levels stable. Active, has a regular exercise routine. Enjoys some usual interests and activities. Single. Student. Has a boyfriend.Lives with 2 room-mates. Mother in [place]. Father in [place]. Spending time with family. Appetite adequate. Weight loss - 159 pounds. Sleeps well most nights. Averages 8 to 9 hours. Focus and concentration stable. Completing tasks. Managing aspects of household. 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 Has finished her college pre-requisites and thinking about options   Previous medication trials: Lexapro, Xanax   PHQ2-9    Flowsheet Row Office Visit from 01/10/2017 in [place] Visit from 10/04/2016 in [place] Visit from 09/15/2016 in [place] Visit from 06/07/2016 in [place] Visit from 12/30/2015 in [place]  PHQ-2 Total Score 0 0 0 0 0  PHQ-9 Total Score -- -- -- 0 0      Flowsheet Row Admission (Discharged) from 08/11/2020 in [place] No Risk        Review of Systems:  Review of Systems  Musculoskeletal:  Negative for gait problem.  Neurological:  Negative for tremors.  Psychiatric/Behavioral:          Please refer to HPI   Medications: I have reviewed the patient's current medications.  Current Outpatient Medications  Medication Sig Dispense Refill   ALPRAZolam (XANAX) 0.5 MG tablet Take 1 tablet (0.5 mg total) by mouth 3 (three) times daily. 90 tablet 2   desvenlafaxine (PRISTIQ) 50 MG 24 hr tablet Take 1 tablet (50 mg total) by mouth daily. 30 tablet 5   No current facility-administered medications for this visit.    Medication Side Effects: None  Allergies:  Allergies  Allergen Reactions   Oxycodone Hives and Rash   Shellfish Allergy Other (See Comments)    Unknown-not specifically determined Unknown-not specifically determined    Past Medical History:  Diagnosis Date   Anxiety    Cystitis    Depression    History of UTI    PONV (postoperative nausea and vomiting)     Past Medical History, Surgical history, Social history, and Family history were reviewed and updated as appropriate.   Please see review of systems for further details on the patient's review from today.   Objective:   Physical Exam:  There were no vitals taken for this visit.  Physical Exam Constitutional:      General: She is not in acute distress. Musculoskeletal:        General: No deformity.  Neurological:     Mental Status: She is alert and oriented to person, place, and time.     Coordination: Coordination normal.  Psychiatric:        Attention and Perception: Attention and perception normal. She does not perceive auditory or visual hallucinations.  Mood and Affect: Mood normal. Mood is not anxious or depressed. Affect is not labile, blunt, angry or inappropriate.        Speech: Speech normal.        Behavior: Behavior normal.        Thought Content: Thought content normal. Thought content is not paranoid or delusional. Thought content does not include homicidal or suicidal ideation. Thought content does not include homicidal or suicidal plan.        Cognition and Memory:  Cognition and memory normal.        Judgment: Judgment normal.     Comments: Insight intact    Lab Review:     Component Value Date/Time   NA 137 07/14/2019 1920   NA 140 01/10/2017 1819   K 3.5 07/14/2019 1920   CL 105 07/14/2019 1920   CO2 22 07/14/2019 1920   GLUCOSE 109 (H) 07/14/2019 1920   BUN 15 07/14/2019 1920   BUN 5 01/10/2017 1819   CREATININE 0.91 07/14/2019 1920   CALCIUM 10.0 07/14/2019 1920   PROT 8.8 (H) 07/14/2019 1920   PROT 7.0 01/10/2017 1819   ALBUMIN 5.4 (H) 07/14/2019 1920   ALBUMIN 4.3 01/10/2017 1819   AST 21 07/14/2019 1920   ALT 14 07/14/2019 1920   ALKPHOS 79 07/14/2019 1920   BILITOT 0.9 07/14/2019 1920   BILITOT <0.2 01/10/2017 1819   GFRNONAA >60 07/14/2019 1920   GFRAA >60 07/14/2019 1920       Component Value Date/Time   WBC 19.2 (H) 07/14/2019 1920   RBC 5.50 (H) 07/14/2019 1920   HGB 16.1 (H) 07/14/2019 1920   HGB 12.9 01/10/2017 1819   HCT 49.2 (H) 07/14/2019 1920   HCT 36.9 01/10/2017 1819   PLT 490 (H) 07/14/2019 1920   PLT 161 01/10/2017 1819   MCV 89.5 07/14/2019 1920   MCV 84 01/10/2017 1819   MCH 29.3 07/14/2019 1920   MCHC 32.7 07/14/2019 1920   RDW 12.0 07/14/2019 1920   RDW 13.7 01/10/2017 1819   LYMPHSABS 1.2 01/10/2017 1819   EOSABS 0.1 01/10/2017 1819   BASOSABS 0.0 01/10/2017 1819    No results found for: POCLITH, LITHIUM   No results found for: PHENYTOIN, PHENOBARB, VALPROATE, CBMZ   .res Assessment: Plan:    Plan:  Xanax 0.5mg  TID prn anxiety Pristiq 50mg  daily.  RTC 3 months   Patient advised to contact office with any questions, adverse effects, or acute worsening in signs and symptoms.  Discussed potential benefits, risk, and side effects of benzodiazepines to include potential risk of tolerance and dependence, as well as possible drowsiness.  Advised patient not to drive if experiencing drowsiness and to take lowest possible effective dose to minimize risk of dependence and  tolerance.  Diagnoses and all orders for this visit:  Panic disorder  Generalized anxiety disorder  Major depression, single episode, in complete remission [facility])     Please see After Visit Summary for patient specific instructions.  No future appointments.  No orders of the defined types were placed in this encounter.   -------------------------------

## 2021-05-25 ENCOUNTER — Other Ambulatory Visit (HOSPITAL_COMMUNITY): Payer: Self-pay

## 2021-06-16 ENCOUNTER — Other Ambulatory Visit (HOSPITAL_COMMUNITY): Payer: Self-pay

## 2021-06-24 ENCOUNTER — Other Ambulatory Visit (HOSPITAL_COMMUNITY): Payer: Self-pay

## 2021-07-02 DIAGNOSIS — R000 Tachycardia, unspecified: Secondary | ICD-10-CM | POA: Diagnosis not present

## 2021-07-02 DIAGNOSIS — B349 Viral infection, unspecified: Secondary | ICD-10-CM | POA: Diagnosis not present

## 2021-07-13 ENCOUNTER — Other Ambulatory Visit (HOSPITAL_COMMUNITY): Payer: Self-pay

## 2021-07-13 DIAGNOSIS — L700 Acne vulgaris: Secondary | ICD-10-CM | POA: Diagnosis not present

## 2021-07-13 DIAGNOSIS — D485 Neoplasm of uncertain behavior of skin: Secondary | ICD-10-CM | POA: Diagnosis not present

## 2021-07-13 DIAGNOSIS — D225 Melanocytic nevi of trunk: Secondary | ICD-10-CM | POA: Diagnosis not present

## 2021-07-13 MED ORDER — CLINDAMYCIN PHOSPHATE 1 % EX FOAM
CUTANEOUS | 4 refills | Status: DC
  Filled 2021-07-13: qty 50, 30d supply, fill #0

## 2021-07-14 ENCOUNTER — Other Ambulatory Visit (HOSPITAL_COMMUNITY): Payer: Self-pay

## 2021-07-15 ENCOUNTER — Other Ambulatory Visit (HOSPITAL_COMMUNITY): Payer: Self-pay

## 2021-07-19 ENCOUNTER — Other Ambulatory Visit (HOSPITAL_COMMUNITY): Payer: Self-pay

## 2021-07-19 MED ORDER — CLINDAMYCIN PHOSPHATE 1 % EX LOTN
TOPICAL_LOTION | CUTANEOUS | 5 refills | Status: DC
  Filled 2021-07-19: qty 60, 30d supply, fill #0

## 2021-07-21 ENCOUNTER — Other Ambulatory Visit (HOSPITAL_COMMUNITY): Payer: Self-pay

## 2021-07-24 ENCOUNTER — Other Ambulatory Visit (HOSPITAL_COMMUNITY): Payer: Self-pay

## 2021-08-06 ENCOUNTER — Other Ambulatory Visit (HOSPITAL_COMMUNITY): Payer: Self-pay

## 2021-08-07 ENCOUNTER — Other Ambulatory Visit (HOSPITAL_COMMUNITY): Payer: Self-pay

## 2021-08-11 ENCOUNTER — Ambulatory Visit: Payer: 59 | Admitting: Adult Health

## 2021-08-23 ENCOUNTER — Ambulatory Visit (INDEPENDENT_AMBULATORY_CARE_PROVIDER_SITE_OTHER): Payer: Self-pay | Admitting: Adult Health

## 2021-08-23 DIAGNOSIS — Z91199 Patient's noncompliance with other medical treatment and regimen due to unspecified reason: Secondary | ICD-10-CM

## 2021-08-23 NOTE — Progress Notes (Signed)
Patient no show appointment. ? ?

## 2021-08-28 ENCOUNTER — Other Ambulatory Visit (HOSPITAL_COMMUNITY): Payer: Self-pay

## 2021-09-02 ENCOUNTER — Other Ambulatory Visit (HOSPITAL_COMMUNITY): Payer: Self-pay

## 2021-09-25 ENCOUNTER — Other Ambulatory Visit: Payer: Self-pay | Admitting: Adult Health

## 2021-09-25 ENCOUNTER — Other Ambulatory Visit (HOSPITAL_COMMUNITY): Payer: Self-pay

## 2021-09-25 DIAGNOSIS — F410 Panic disorder [episodic paroxysmal anxiety] without agoraphobia: Secondary | ICD-10-CM

## 2021-09-25 DIAGNOSIS — F411 Generalized anxiety disorder: Secondary | ICD-10-CM

## 2021-09-28 DIAGNOSIS — J029 Acute pharyngitis, unspecified: Secondary | ICD-10-CM | POA: Diagnosis not present

## 2021-09-28 NOTE — Telephone Encounter (Signed)
Pt would like refill on Alprazolam to [facility].  Next appt 6/16

## 2021-09-29 ENCOUNTER — Other Ambulatory Visit (HOSPITAL_COMMUNITY): Payer: Self-pay

## 2021-09-29 MED ORDER — ALPRAZOLAM 0.5 MG PO TABS
0.5000 mg | ORAL_TABLET | Freq: Three times a day (TID) | ORAL | 0 refills | Status: DC
  Filled 2021-09-29: qty 90, 30d supply, fill #0

## 2021-09-29 NOTE — Telephone Encounter (Signed)
Last filled 4/8 appt on 6/16

## 2021-10-06 ENCOUNTER — Emergency Department (HOSPITAL_BASED_OUTPATIENT_CLINIC_OR_DEPARTMENT_OTHER): Payer: 59

## 2021-10-06 ENCOUNTER — Emergency Department (HOSPITAL_BASED_OUTPATIENT_CLINIC_OR_DEPARTMENT_OTHER)
Admission: EM | Admit: 2021-10-06 | Discharge: 2021-10-06 | Disposition: A | Discharge status: Home / Self Care | Payer: 59 | Attending: Emergency Medicine | Admitting: Emergency Medicine

## 2021-10-06 ENCOUNTER — Other Ambulatory Visit (HOSPITAL_BASED_OUTPATIENT_CLINIC_OR_DEPARTMENT_OTHER): Payer: Self-pay

## 2021-10-06 ENCOUNTER — Encounter (HOSPITAL_BASED_OUTPATIENT_CLINIC_OR_DEPARTMENT_OTHER): Payer: Self-pay

## 2021-10-06 ENCOUNTER — Other Ambulatory Visit: Payer: Self-pay

## 2021-10-06 DIAGNOSIS — J029 Acute pharyngitis, unspecified: Secondary | ICD-10-CM | POA: Diagnosis not present

## 2021-10-06 DIAGNOSIS — J039 Acute tonsillitis, unspecified: Secondary | ICD-10-CM

## 2021-10-06 DIAGNOSIS — J358 Other chronic diseases of tonsils and adenoids: Secondary | ICD-10-CM | POA: Diagnosis not present

## 2021-10-06 LAB — MONONUCLEOSIS SCREEN: Mono Screen: NEGATIVE

## 2021-10-06 LAB — GROUP A STREP BY PCR: Group A Strep by PCR: NOT DETECTED

## 2021-10-06 MED ORDER — IOHEXOL 300 MG/ML  SOLN
75.0000 mL | Freq: Once | INTRAMUSCULAR | Status: AC | PRN
  Administered 2021-10-06: 75 mL via INTRAVENOUS

## 2021-10-06 MED ORDER — DEXAMETHASONE SODIUM PHOSPHATE 10 MG/ML IJ SOLN
10.0000 mg | Freq: Once | INTRAMUSCULAR | Status: AC
  Administered 2021-10-06: 10 mg via INTRAVENOUS

## 2021-10-06 MED ORDER — DEXAMETHASONE SODIUM PHOSPHATE 10 MG/ML IJ SOLN
10.0000 mg | Freq: Once | INTRAMUSCULAR | Status: DC
  Filled 2021-10-06: qty 1

## 2021-10-06 MED ORDER — CLINDAMYCIN HCL 150 MG PO CAPS
150.0000 mg | ORAL_CAPSULE | Freq: Four times a day (QID) | ORAL | 0 refills | Status: DC
  Filled 2021-10-06: qty 28, 7d supply, fill #0

## 2021-10-06 NOTE — ED Notes (Signed)
Discharge instructions, follow up care, and prescriptions reviewed and explained, pt verbalized understanding. Pt had no additional questions at d/c. Pt caox4 and ambulatory in no obvious distress on departure.

## 2021-10-06 NOTE — ED Provider Notes (Signed)
[facility] EMERGENCY DEPT Provider Note   CSN: [id number] Arrival date & time: 10/06/21  5621     History  Chief Complaint  Patient presents with   Sore Throat    Krystal Peters is a 21 y.o. female.  21 year old female presents with sore throat for about a week.  Seen her physician's office and has been on antibiotics and anti-inflammatories without relief.  Does note some trouble swallowing.  Has had temperature at home up to 102 has been self-medicating.  Denies being short of breath.  No abdominal discomfort.  No rashes noted.      Home Medications Prior to Admission medications   Medication Sig Start Date End Date Taking? Authorizing Provider  ALPRAZolam Duanne Moron) 0.5 MG tablet Take 1 tablet by mouth 3 times daily. 09/29/21   Cottle, Billey Co., MD  clindamycin (CLEOCIN T) 1 % lotion Apply as directed to skin once a day after shower 07/19/21     Clindamycin Phosphate foam Apply to affected areas once a day after shower. 07/13/21     desvenlafaxine (PRISTIQ) 50 MG 24 hr tablet Take 1 tablet (50 mg total) by mouth daily. 04/14/21   Mozingo, Berdie Ogren, NP      Allergies    Oxycodone and Shellfish allergy    Review of Systems   Review of Systems  All other systems reviewed and are negative.  Physical Exam Updated Vital Signs BP 125/76   Pulse 93   Temp 98.3 F (36.8 C)   Resp 17   Ht 1.6 m ('5\' 3"'$ )   Wt 70.8 kg   SpO2 98%   BMI 27.63 kg/m  Physical Exam Vitals and nursing note reviewed.  Constitutional:      General: She is not in acute distress.    Appearance: Normal appearance. She is well-developed. She is not toxic-appearing.  HENT:     Head: Normocephalic and atraumatic.     Mouth/Throat:     Pharynx: Posterior oropharyngeal erythema present.     Tonsils: No tonsillar exudate.  Eyes:     General: Lids are normal.     Conjunctiva/sclera: Conjunctivae normal.     Pupils: Pupils are equal, round, and reactive to light.  Neck:      Thyroid: No thyroid mass.     Trachea: No tracheal deviation.  Cardiovascular:     Rate and Rhythm: Normal rate and regular rhythm.     Heart sounds: Normal heart sounds. No murmur heard.   No gallop.  Pulmonary:     Effort: Pulmonary effort is normal. No respiratory distress.     Breath sounds: Normal breath sounds. No stridor. No decreased breath sounds, wheezing, rhonchi or rales.  Abdominal:     General: There is no distension.     Palpations: Abdomen is soft.     Tenderness: There is no abdominal tenderness. There is no rebound.  Musculoskeletal:        General: No tenderness. Normal range of motion.     Cervical back: Normal range of motion and neck supple.  Skin:    General: Skin is warm and dry.     Findings: No abrasion or rash.  Neurological:     Mental Status: She is alert and oriented to person, place, and time. Mental status is at baseline.     GCS: GCS eye subscore is 4. GCS verbal subscore is 5. GCS motor subscore is 6.     Cranial Nerves: No cranial nerve deficit.  Sensory: No sensory deficit.     Motor: Motor function is intact.  Psychiatric:        Attention and Perception: Attention normal.        Speech: Speech normal.        Behavior: Behavior normal.    ED Results / Procedures / Treatments   Labs (all labs ordered are listed, but only abnormal results are displayed) Labs Reviewed  GROUP A STREP BY PCR  MONONUCLEOSIS SCREEN    EKG None  Radiology No results found.  Procedures Procedures    Medications Ordered in ED Medications - No data to display  ED Course/ Medical Decision Making/ A&P                           Medical Decision Making Amount and/or Complexity of Data Reviewed Labs: ordered. Radiology: ordered.  Risk Prescription drug management.   Patient with negative strep and negative monotest.  Continue to complain of sore throat.  CT scan of soft tissue cervical spine was negative for abscess but does show tonsillitis per  my interpretation.  Patient had been on amoxicillin in the past.  We will start her on clindamycin.  She has no evidence of airway compromise.  Does not need admission at this time.  Will discharge home        Final Clinical Impression(s) / ED Diagnoses Final diagnoses:  None    Rx / DC Orders ED Discharge Orders     None         Lacretia Leigh, MD 10/06/21 1228

## 2021-10-06 NOTE — ED Triage Notes (Signed)
Pt reports sore throat for over 1 week. Pt state she was tested for strep and it was negative. Her pcp did start her on a course of abx without any relief. PCP sent her to the ed today for concerns of an abscess.

## 2021-10-07 ENCOUNTER — Other Ambulatory Visit (HOSPITAL_COMMUNITY): Payer: Self-pay

## 2021-10-15 ENCOUNTER — Encounter: Payer: Self-pay | Admitting: Adult Health

## 2021-10-15 ENCOUNTER — Other Ambulatory Visit (HOSPITAL_COMMUNITY): Payer: Self-pay

## 2021-10-15 ENCOUNTER — Telehealth (INDEPENDENT_AMBULATORY_CARE_PROVIDER_SITE_OTHER): Payer: 59 | Admitting: Adult Health

## 2021-10-15 DIAGNOSIS — F410 Panic disorder [episodic paroxysmal anxiety] without agoraphobia: Secondary | ICD-10-CM | POA: Diagnosis not present

## 2021-10-15 DIAGNOSIS — F325 Major depressive disorder, single episode, in full remission: Secondary | ICD-10-CM

## 2021-10-15 DIAGNOSIS — F411 Generalized anxiety disorder: Secondary | ICD-10-CM

## 2021-10-15 MED ORDER — ALPRAZOLAM 0.5 MG PO TABS
0.5000 mg | ORAL_TABLET | Freq: Three times a day (TID) | ORAL | 2 refills | Status: DC
  Filled 2021-10-15 – 2021-11-08 (×2): qty 90, 30d supply, fill #0
  Filled 2021-12-06: qty 90, 30d supply, fill #1
  Filled 2022-01-03: qty 90, 30d supply, fill #2

## 2021-10-15 MED ORDER — DESVENLAFAXINE SUCCINATE ER 50 MG PO TB24
50.0000 mg | ORAL_TABLET | Freq: Every day | ORAL | 3 refills | Status: DC
  Filled 2021-10-15 – 2021-11-05 (×2): qty 90, 90d supply, fill #0

## 2021-10-15 NOTE — Progress Notes (Signed)
Krystal Peters [id number] 2000-08-20 21 y.o.  Virtual Visit via Video Note  I connected with pt @ on 10/15/21 at  8:20 AM EDT by a video enabled telemedicine application and verified that I am speaking with the correct person using two identifiers.   I discussed the limitations of evaluation and management by telemedicine and the availability of in person appointments. The patient expressed understanding and agreed to proceed.  I discussed the assessment and treatment plan with the patient. The patient was provided an opportunity to ask questions and all were answered. The patient agreed with the plan and demonstrated an understanding of the instructions.   The patient was advised to call back or seek an in-person evaluation if the symptoms worsen or if the condition fails to improve as anticipated.  I provided 20 minutes of non-face-to-face time during this encounter.  The patient was located at home.  The provider was located at [place].   Aloha Gell, NP   Subjective:   Patient ID:  Krystal Peters is a 21 y.o. (DOB Jun 25, 2000) female.  Chief Complaint: No chief complaint on file.   HPI Krystal Peters presents for follow-up of depression, anxiety and panic attacks.  Describes mood today as better". Pleasant. Mood symptoms - reports decreased anxiety - more "baseline".Denies depression and irritability. Reports decreased panic attacks. Mood is consistent. Feels like Pristiq at '50mg'$  has been helpful. Stable interest and motivation. Taking medications as prescribed. Energy levels stable. Active, has a regular exercise routine.Going to the gym.  Enjoys some usual interests and activities. Single. Student. Lives with 2 room-mates. Mother in [place]. Father in [place]. Spending time with family. Appetite adequate. Weight loss - 152 - 159 pounds. Sleeps well most nights. Averages 8 to 9 hours. Focus and concentration stable. Completing tasks. Managing aspects of  household. 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  Denies AH or VH.  Previous medication trials: Lexapro, Xanax  Review of Systems:  Review of Systems  Musculoskeletal:  Negative for gait problem.  Neurological:  Negative for tremors.  Psychiatric/Behavioral:         Please refer to HPI    Medications: I have reviewed the patient's current medications.  Current Outpatient Medications  Medication Sig Dispense Refill   ALPRAZolam (XANAX) 0.5 MG tablet Take 1 tablet by mouth 3 times daily. 90 tablet 2   clindamycin (CLEOCIN T) 1 % lotion Apply as directed to skin once a day after shower 60 mL 5   clindamycin (CLEOCIN) 150 MG capsule Take 1 capsule (150 mg total) by mouth every 6 (six) hours. 28 capsule 0   Clindamycin Phosphate foam Apply to affected areas once a day after shower. 50 g 4   desvenlafaxine (PRISTIQ) 50 MG 24 hr tablet Take 1 tablet (50 mg total) by mouth daily. 90 tablet 3   No current facility-administered medications for this visit.    Medication Side Effects: None  Allergies:  Allergies  Allergen Reactions   Oxycodone Hives and Rash   Shellfish Allergy Other (See Comments)    Unknown-not specifically determined Unknown-not specifically determined    Past Medical History:  Diagnosis Date   Anxiety    Cystitis    Depression    History of UTI    PONV (postoperative nausea and vomiting)     Family History  Problem Relation Age of Onset   Thyroid disease Maternal  Grandmother    Heart disease Maternal Grandfather    Stroke Maternal Grandfather    Heart disease Paternal Grandmother     Social History   Socioeconomic History   Marital status: Single    Spouse name: Not on file   Number of children: Not on file   Years of education: Not on file   Highest education level: Not on file  Occupational History   Not on file  Tobacco Use   Smoking status: Never   Smokeless  tobacco: Never  Vaping Use   Vaping Use: Never used  Substance and Sexual Activity   Alcohol use: No   Drug use: No   Sexual activity: Never    Birth control/protection: None  Other Topics Concern   Not on file  Social History Narrative   Not on file   Social Determinants of Health   Financial Resource Strain: Not on file  Food Insecurity: Not on file  Transportation Needs: Not on file  Physical Activity: Not on file  Stress: Not on file  Social Connections: Not on file  Intimate Partner Violence: Not on file    Past Medical History, Surgical history, Social history, and Family history were reviewed and updated as appropriate.   Please see review of systems for further details on the patient's review from today.   Objective:   Physical Exam:  There were no vitals taken for this visit.  Physical Exam Constitutional:      General: She is not in acute distress. Musculoskeletal:        General: No deformity.  Neurological:     Mental Status: She is alert and oriented to person, place, and time.     Coordination: Coordination normal.  Psychiatric:        Attention and Perception: Attention and perception normal. She does not perceive auditory or visual hallucinations.        Mood and Affect: Mood normal. Mood is not anxious or depressed. Affect is not labile, blunt, angry or inappropriate.        Speech: Speech normal.        Behavior: Behavior normal.        Thought Content: Thought content normal. Thought content is not paranoid or delusional. Thought content does not include homicidal or suicidal ideation. Thought content does not include homicidal or suicidal plan.        Cognition and Memory: Cognition and memory normal.        Judgment: Judgment normal.     Comments: Insight intact     Lab Review:     Component Value Date/Time   NA 137 07/14/2019 1920   NA 140 01/10/2017 1819   K 3.5 07/14/2019 1920   CL 105 07/14/2019 1920   CO2 22 07/14/2019 1920    GLUCOSE 109 (H) 07/14/2019 1920   BUN 15 07/14/2019 1920   BUN 5 01/10/2017 1819   CREATININE 0.91 07/14/2019 1920   CALCIUM 10.0 07/14/2019 1920   PROT 8.8 (H) 07/14/2019 1920   PROT 7.0 01/10/2017 1819   ALBUMIN 5.4 (H) 07/14/2019 1920   ALBUMIN 4.3 01/10/2017 1819   AST 21 07/14/2019 1920   ALT 14 07/14/2019 1920   ALKPHOS 79 07/14/2019 1920   BILITOT 0.9 07/14/2019 1920   BILITOT <0.2 01/10/2017 1819   GFRNONAA >60 07/14/2019 1920   GFRAA >60 07/14/2019 1920       Component Value Date/Time   WBC 19.2 (H) 07/14/2019 1920   RBC 5.50 (H) 07/14/2019 1920  HGB 16.1 (H) 07/14/2019 1920   HGB 12.9 01/10/2017 1819   HCT 49.2 (H) 07/14/2019 1920   HCT 36.9 01/10/2017 1819   PLT 490 (H) 07/14/2019 1920   PLT 161 01/10/2017 1819   MCV 89.5 07/14/2019 1920   MCV 84 01/10/2017 1819   MCH 29.3 07/14/2019 1920   MCHC 32.7 07/14/2019 1920   RDW 12.0 07/14/2019 1920   RDW 13.7 01/10/2017 1819   LYMPHSABS 1.2 01/10/2017 1819   EOSABS 0.1 01/10/2017 1819   BASOSABS 0.0 01/10/2017 1819    No results found for: "POCLITH", "LITHIUM"   No results found for: "PHENYTOIN", "PHENOBARB", "VALPROATE", "CBMZ"   .res Assessment: Plan:    Plan:  Xanax 0.'5mg'$  TID prn anxiety Pristiq '50mg'$  daily.  RTC 6 months   Patient advised to contact office with any questions, adverse effects, or acute worsening in signs and symptoms.  Discussed potential benefits, risk, and side effects of benzodiazepines to include potential risk of tolerance and dependence, as well as possible drowsiness. Advised patient not to drive if experiencing drowsiness and to take lowest possible effective dose to minimize risk of dependence and tolerance.   Diagnoses and all orders for this visit:  Generalized anxiety disorder -     ALPRAZolam (XANAX) 0.5 MG tablet; Take 1 tablet by mouth 3 times daily. -     desvenlafaxine (PRISTIQ) 50 MG 24 hr tablet; Take 1 tablet (50 mg total) by mouth daily.  Major depression,  single episode, in complete remission (HCC)  Panic disorder -     ALPRAZolam (XANAX) 0.5 MG tablet; Take 1 tablet by mouth 3 times daily.     Please see After Visit Summary for patient specific instructions.  No future appointments.  No orders of the defined types were placed in this encounter.     -------------------------------

## 2021-10-27 ENCOUNTER — Other Ambulatory Visit (HOSPITAL_COMMUNITY): Payer: Self-pay

## 2021-11-05 ENCOUNTER — Other Ambulatory Visit (HOSPITAL_COMMUNITY): Payer: Self-pay

## 2021-11-08 ENCOUNTER — Other Ambulatory Visit (HOSPITAL_COMMUNITY): Payer: Self-pay

## 2021-12-06 ENCOUNTER — Other Ambulatory Visit (HOSPITAL_COMMUNITY): Payer: Self-pay

## 2021-12-22 DIAGNOSIS — N898 Other specified noninflammatory disorders of vagina: Secondary | ICD-10-CM | POA: Diagnosis not present

## 2021-12-22 DIAGNOSIS — Z309 Encounter for contraceptive management, unspecified: Secondary | ICD-10-CM | POA: Diagnosis not present

## 2021-12-22 DIAGNOSIS — Z113 Encounter for screening for infections with a predominantly sexual mode of transmission: Secondary | ICD-10-CM | POA: Diagnosis not present

## 2021-12-22 DIAGNOSIS — Z6827 Body mass index (BMI) 27.0-27.9, adult: Secondary | ICD-10-CM | POA: Diagnosis not present

## 2021-12-22 DIAGNOSIS — Z01419 Encounter for gynecological examination (general) (routine) without abnormal findings: Secondary | ICD-10-CM | POA: Diagnosis not present

## 2022-01-03 ENCOUNTER — Other Ambulatory Visit (HOSPITAL_COMMUNITY): Payer: Self-pay

## 2022-01-04 ENCOUNTER — Other Ambulatory Visit (HOSPITAL_COMMUNITY): Payer: Self-pay

## 2022-01-19 DIAGNOSIS — Z579 Occupational exposure to unspecified risk factor: Secondary | ICD-10-CM | POA: Diagnosis not present

## 2022-01-19 DIAGNOSIS — E559 Vitamin D deficiency, unspecified: Secondary | ICD-10-CM | POA: Diagnosis not present

## 2022-01-19 DIAGNOSIS — Z23 Encounter for immunization: Secondary | ICD-10-CM | POA: Diagnosis not present

## 2022-01-19 DIAGNOSIS — Z0000 Encounter for general adult medical examination without abnormal findings: Secondary | ICD-10-CM | POA: Diagnosis not present

## 2022-01-19 DIAGNOSIS — Z79899 Other long term (current) drug therapy: Secondary | ICD-10-CM | POA: Diagnosis not present

## 2022-01-19 DIAGNOSIS — E538 Deficiency of other specified B group vitamins: Secondary | ICD-10-CM | POA: Diagnosis not present

## 2022-01-25 ENCOUNTER — Telehealth: Payer: Self-pay | Admitting: Adult Health

## 2022-01-25 NOTE — Telephone Encounter (Signed)
Patient lvm to be seen sooner than the December appt. Scheduled (10/5) Krystal Peters is also requesting a refill on the Xanax. She uses the [facility].  Contact information # [phone]

## 2022-01-26 NOTE — Telephone Encounter (Signed)
Filled 9/5 due 10/3

## 2022-02-01 ENCOUNTER — Other Ambulatory Visit: Payer: Self-pay

## 2022-02-01 ENCOUNTER — Other Ambulatory Visit (HOSPITAL_COMMUNITY): Payer: Self-pay

## 2022-02-01 DIAGNOSIS — F411 Generalized anxiety disorder: Secondary | ICD-10-CM

## 2022-02-01 DIAGNOSIS — F410 Panic disorder [episodic paroxysmal anxiety] without agoraphobia: Secondary | ICD-10-CM

## 2022-02-01 MED ORDER — ALPRAZOLAM 0.5 MG PO TABS
0.5000 mg | ORAL_TABLET | Freq: Three times a day (TID) | ORAL | 0 refills | Status: DC
  Filled 2022-02-01: qty 90, 30d supply, fill #0

## 2022-02-01 NOTE — Telephone Encounter (Signed)
Pended.

## 2022-02-03 ENCOUNTER — Encounter: Payer: Self-pay | Admitting: Adult Health

## 2022-02-03 ENCOUNTER — Ambulatory Visit (INDEPENDENT_AMBULATORY_CARE_PROVIDER_SITE_OTHER): Payer: 59 | Admitting: Adult Health

## 2022-02-03 DIAGNOSIS — F325 Major depressive disorder, single episode, in full remission: Secondary | ICD-10-CM | POA: Diagnosis not present

## 2022-02-03 DIAGNOSIS — F410 Panic disorder [episodic paroxysmal anxiety] without agoraphobia: Secondary | ICD-10-CM

## 2022-02-03 DIAGNOSIS — F411 Generalized anxiety disorder: Secondary | ICD-10-CM

## 2022-02-03 MED ORDER — ALPRAZOLAM 0.5 MG PO TABS
0.5000 mg | ORAL_TABLET | Freq: Three times a day (TID) | ORAL | 2 refills | Status: DC
  Filled 2022-02-03 – 2022-03-02 (×2): qty 90, 30d supply, fill #0
  Filled 2022-04-06: qty 90, 30d supply, fill #1
  Filled 2022-04-30 – 2022-05-05 (×4): qty 90, 30d supply, fill #2

## 2022-02-03 MED ORDER — DESVENLAFAXINE SUCCINATE ER 25 MG PO TB24
25.0000 mg | ORAL_TABLET | Freq: Every day | ORAL | 0 refills | Status: DC
  Filled 2022-02-03 – 2022-05-26 (×2): qty 30, 30d supply, fill #0

## 2022-02-03 NOTE — Progress Notes (Signed)
Krystal Peters [id number] [birth year] 21 y.o.  Subjective:   Patient ID:  Krystal Peters is a 21 y.o. (DOB 2001/04/14) female.  Chief Complaint: No chief complaint on file.   HPI Krystal Peters presents to the office today for follow-up of depression, anxiety and panic attacks.  Describes mood today as "not the best". Pleasant. Tearful at times. Mood symptoms - denies depression. Feels anxious and irritable - ""getting worse". Reports panic attacks. Mood is consistent. Has been taking Pristiq '50mg'$  daily and does not feel like it is helpful - would like to taper off. Willing to consider other options, but would like to continue the Xanax as needed for now.Stable interest and motivation. Taking medications as prescribed. Energy levels stable. Active, has a regular exercise routine.Going to the gym.  Enjoys some usual interests and activities. Single. Student. Lives with 2 room-mates. Mother in [place]. Father in [place]. Spending time with family. Appetite adequate. Weight loss - 153 pounds. Sleeps well most nights. Averages 9 to 10 hours. Focus and concentration stable. Completing tasks. Managing aspects of household. 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  Denies AH or VH.  Previous medication trials: Lexapro, Xanax, Zoloft    PHQ2-9    Flowsheet Row Office Visit from 01/10/2017 in [facility] Visit from 10/04/2016 in [facility] Visit from 09/15/2016 in [facility] [place] Visit from 06/07/2016 in [place] Visit from 12/30/2015 in [facility]  PHQ-2 Total Score 0 0 0 0 0  PHQ-9 Total Score -- -- -- 0 0      Flowsheet Row ED from 10/06/2021 in [facility] Admission (Discharged) from 08/11/2020 in [place] No Risk No Risk        Review of Systems:   Review of Systems  Musculoskeletal:  Negative for gait problem.  Neurological:  Negative for tremors.  Psychiatric/Behavioral:         Please refer to HPI    Medications: I have reviewed the patient's current medications.  Current Outpatient Medications  Medication Sig Dispense Refill   desvenlafaxine (PRISTIQ) 25 MG 24 hr tablet Take 1 tablet (25 mg total) by mouth daily. 30 tablet 0   ALPRAZolam (XANAX) 0.5 MG tablet Take 1 tablet (0.5 mg total) by mouth 3 (three) times daily. 90 tablet 2   clindamycin (CLEOCIN T) 1 % lotion Apply as directed to skin once a day after shower 60 mL 5   clindamycin (CLEOCIN) 150 MG capsule Take 1 capsule (150 mg total) by mouth every 6 (six) hours. 28 capsule 0   Clindamycin Phosphate foam Apply to affected areas once a day after shower. 50 g 4   desvenlafaxine (PRISTIQ) 50 MG 24 hr tablet Take 1 tablet by mouth daily. 90 tablet 3   No current facility-administered medications for this visit.    Medication Side Effects: None  Allergies:  Allergies  Allergen Reactions   Oxycodone Hives and Rash   Shellfish Allergy Other (See Comments)    Unknown-not specifically determined Unknown-not specifically determined    Past Medical History:  Diagnosis Date   Anxiety    Cystitis    Depression    History of UTI    PONV (postoperative nausea and vomiting)     Past Medical History, Surgical history, Social history, and Family history were reviewed and updated as appropriate.   Please  see review of systems for further details on the patient's review from today.   Objective:   Physical Exam:  There were no vitals taken for this visit.  Physical Exam Constitutional:      General: She is not in acute distress. Musculoskeletal:        General: No deformity.  Neurological:     Mental Status: She is alert and oriented to person, place, and time.     Coordination: Coordination normal.  Psychiatric:        Attention and Perception: Attention and  perception normal. She does not perceive auditory or visual hallucinations.        Mood and Affect: Mood normal. Mood is not anxious or depressed. Affect is not labile, blunt, angry or inappropriate.        Speech: Speech normal.        Behavior: Behavior normal.        Thought Content: Thought content normal. Thought content is not paranoid or delusional. Thought content does not include homicidal or suicidal ideation. Thought content does not include homicidal or suicidal plan.        Cognition and Memory: Cognition and memory normal.        Judgment: Judgment normal.     Comments: Insight intact     Lab Review:     Component Value Date/Time   NA 137 07/14/2019 1920   NA 140 01/10/2017 1819   K 3.5 07/14/2019 1920   CL 105 07/14/2019 1920   CO2 22 07/14/2019 1920   GLUCOSE 109 (H) 07/14/2019 1920   BUN 15 07/14/2019 1920   BUN 5 01/10/2017 1819   CREATININE 0.91 07/14/2019 1920   CALCIUM 10.0 07/14/2019 1920   PROT 8.8 (H) 07/14/2019 1920   PROT 7.0 01/10/2017 1819   ALBUMIN 5.4 (H) 07/14/2019 1920   ALBUMIN 4.3 01/10/2017 1819   AST 21 07/14/2019 1920   ALT 14 07/14/2019 1920   ALKPHOS 79 07/14/2019 1920   BILITOT 0.9 07/14/2019 1920   BILITOT <0.2 01/10/2017 1819   GFRNONAA >60 07/14/2019 1920   GFRAA >60 07/14/2019 1920       Component Value Date/Time   WBC 19.2 (H) 07/14/2019 1920   RBC 5.50 (H) 07/14/2019 1920   HGB 16.1 (H) 07/14/2019 1920   HGB 12.9 01/10/2017 1819   HCT 49.2 (H) 07/14/2019 1920   HCT 36.9 01/10/2017 1819   PLT 490 (H) 07/14/2019 1920   PLT 161 01/10/2017 1819   MCV 89.5 07/14/2019 1920   MCV 84 01/10/2017 1819   MCH 29.3 07/14/2019 1920   MCHC 32.7 07/14/2019 1920   RDW 12.0 07/14/2019 1920   RDW 13.7 01/10/2017 1819   LYMPHSABS 1.2 01/10/2017 1819   EOSABS 0.1 01/10/2017 1819   BASOSABS 0.0 01/10/2017 1819    No results found for: "POCLITH", "LITHIUM"   No results found for: "PHENYTOIN", "PHENOBARB", "VALPROATE", "CBMZ"    .res Assessment: Plan:    Plan:  Xanax 0.'5mg'$  TID prn anxiety Decrease Pristiq '50mg'$  to '25mg'$  daily x 7 days, then d/c.  RTC 6 months   Patient advised to contact office with any questions, adverse effects, or acute worsening in signs and symptoms.  Discussed potential benefits, risk, and side effects of benzodiazepines to include potential risk of tolerance and dependence, as well as possible drowsiness. Advised patient not to drive if experiencing drowsiness and to take lowest possible effective dose to minimize risk of dependence and tolerance.  Diagnoses and all orders for this visit:  Major depression, single episode, in complete remission (HCC)  Generalized anxiety disorder -     ALPRAZolam (XANAX) 0.5 MG tablet; Take 1 tablet (0.5 mg total) by mouth 3 (three) times daily. -     desvenlafaxine (PRISTIQ) 25 MG 24 hr tablet; Take 1 tablet (25 mg total) by mouth daily.  Panic attacks     Please see After Visit Summary for patient specific instructions.  Future Appointments  Date Time Provider [place]  04/18/2022  5:20 PM Noble Bodie, Berdie Ogren, NP [facility] None    No orders of the defined types were placed in this encounter.   -------------------------------

## 2022-02-04 ENCOUNTER — Other Ambulatory Visit (HOSPITAL_COMMUNITY): Payer: Self-pay

## 2022-02-19 ENCOUNTER — Other Ambulatory Visit (HOSPITAL_COMMUNITY): Payer: Self-pay

## 2022-03-02 ENCOUNTER — Other Ambulatory Visit (HOSPITAL_COMMUNITY): Payer: Self-pay

## 2022-04-06 ENCOUNTER — Other Ambulatory Visit (HOSPITAL_COMMUNITY): Payer: Self-pay

## 2022-04-18 ENCOUNTER — Ambulatory Visit: Payer: 59 | Admitting: Adult Health

## 2022-04-30 ENCOUNTER — Other Ambulatory Visit (HOSPITAL_COMMUNITY): Payer: Self-pay

## 2022-05-02 ENCOUNTER — Other Ambulatory Visit (HOSPITAL_COMMUNITY): Payer: Self-pay

## 2022-05-02 ENCOUNTER — Other Ambulatory Visit: Payer: Self-pay

## 2022-05-03 ENCOUNTER — Telehealth: Payer: Self-pay | Admitting: Adult Health

## 2022-05-03 ENCOUNTER — Other Ambulatory Visit (HOSPITAL_COMMUNITY): Payer: Self-pay

## 2022-05-03 NOTE — Telephone Encounter (Signed)
Next visit 05/16/22. Krystal Peters went to [place] and said her Alprazolam was taken from her luggage bag. She has no more left. She thinks someone took it. She called [facility] but was denied for early refill. She has none left. Is there anyway she can get authorized for an early refill. Her number is [phone].

## 2022-05-03 NOTE — Telephone Encounter (Signed)
Patient is due for a RF 1/3 and she was notified.

## 2022-05-04 ENCOUNTER — Other Ambulatory Visit (HOSPITAL_COMMUNITY): Payer: Self-pay

## 2022-05-05 ENCOUNTER — Other Ambulatory Visit: Payer: Self-pay

## 2022-05-05 ENCOUNTER — Other Ambulatory Visit (HOSPITAL_COMMUNITY): Payer: Self-pay

## 2022-05-16 ENCOUNTER — Other Ambulatory Visit (HOSPITAL_COMMUNITY): Payer: Self-pay

## 2022-05-16 ENCOUNTER — Encounter: Payer: Self-pay | Admitting: Adult Health

## 2022-05-16 ENCOUNTER — Ambulatory Visit: Payer: Commercial Managed Care - PPO | Admitting: Adult Health

## 2022-05-16 DIAGNOSIS — F411 Generalized anxiety disorder: Secondary | ICD-10-CM | POA: Diagnosis not present

## 2022-05-16 DIAGNOSIS — F410 Panic disorder [episodic paroxysmal anxiety] without agoraphobia: Secondary | ICD-10-CM | POA: Diagnosis not present

## 2022-05-16 DIAGNOSIS — F325 Major depressive disorder, single episode, in full remission: Secondary | ICD-10-CM | POA: Diagnosis not present

## 2022-05-16 MED ORDER — ALPRAZOLAM 0.5 MG PO TABS
0.5000 mg | ORAL_TABLET | Freq: Three times a day (TID) | ORAL | 2 refills | Status: DC
  Filled 2022-05-16 – 2022-06-09 (×2): qty 90, 30d supply, fill #0
  Filled 2022-07-14: qty 90, 30d supply, fill #1
  Filled 2022-08-25 – 2022-08-27 (×2): qty 90, 30d supply, fill #2

## 2022-05-16 NOTE — Progress Notes (Signed)
Krystal Peters [id number] May 09, 2000 22 y.o.  Subjective:   Patient ID:  Krystal Peters is a 22 y.o. (DOB 2000/09/24) female.  Chief Complaint: No chief complaint on file.   HPI Krystal Peters presents to the office today for follow-up of depression, anxiety and panic attacks.  Describes mood today as "not the best". Pleasant. Tearful at times. Mood symptoms - denies depression, anxiety, and irritability. Reports panic attacks - once or twice a week. Mood is consistent. Has tapered off the Pristiq and is doing well without it. Taking Xanax as needed for panic attacks. Stable interest and motivation. Taking medications as prescribed. Energy levels stable. Active, has a regular exercise routine. Going to the gym - 2 to 3 times a week.  Enjoys some usual interests and activities. Single. Student. Lives with 2 room-mates. Mother in [place]. Father in [place]. Spending time with family. Appetite adequate. Weight gain - 160 pounds. Sleeps well most nights. Averages 9 to 10 hours. Focus and concentration stable. Completing tasks. Managing aspects of household. 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  Denies AH or VH.  Previous medication trials: Lexapro, Xanax, Zoloft    PHQ2-9    Flowsheet Row Office Visit from 01/10/2017 in [facility] Visit from 10/04/2016 in [facility] Visit from 09/15/2016 in [facility] Visit from 06/07/2016 in [facility] Visit from 12/30/2015 in [place]  PHQ-2 Total Score 0 0 0 0 0  PHQ-9 Total Score -- -- -- 0 0      Flowsheet Row ED from 10/06/2021 in [facility] Admission (Discharged) from 08/11/2020 in [facility] No Risk No Risk        Review of Systems:  Review of Systems  Musculoskeletal:  Negative for gait problem.   Neurological:  Negative for tremors.  Psychiatric/Behavioral:         Please refer to HPI    Medications: I have reviewed the patient's current medications.  Current Outpatient Medications  Medication Sig Dispense Refill   ALPRAZolam (XANAX) 0.5 MG tablet Take 1 tablet (0.5 mg total) by mouth 3 (three) times daily. 90 tablet 2   desvenlafaxine (PRISTIQ) 25 MG 24 hr tablet Take 1 tablet (25 mg total) by mouth daily. 30 tablet 0   No current facility-administered medications for this visit.    Medication Side Effects: None  Allergies:  Allergies  Allergen Reactions   Oxycodone Hives and Rash   Shellfish Allergy Other (See Comments)    Unknown-not specifically determined Unknown-not specifically determined    Past Medical History:  Diagnosis Date   Anxiety    Cystitis    Depression    History of UTI    PONV (postoperative nausea and vomiting)     Past Medical History, Surgical history, Social history, and Family history were reviewed and updated as appropriate.   Please see review of systems for further details on the patient's review from today.   Objective:   Physical Exam:  There were no vitals taken for this visit.  Physical Exam Constitutional:      General: She is not in acute distress. Musculoskeletal:        General: No deformity.  Neurological:     Mental Status: She is alert and oriented to person, place, and time.     Coordination: Coordination normal.  Psychiatric:  Attention and Perception: Attention and perception normal. She does not perceive auditory or visual hallucinations.        Mood and Affect: Mood normal. Mood is not anxious or depressed. Affect is not labile, blunt, angry or inappropriate.        Speech: Speech normal.        Behavior: Behavior normal.        Thought Content: Thought content normal. Thought content is not paranoid or delusional. Thought content does not include homicidal or suicidal ideation. Thought content does  not include homicidal or suicidal plan.        Cognition and Memory: Cognition and memory normal.        Judgment: Judgment normal.     Comments: Insight intact     Lab Review:     Component Value Date/Time   NA 137 07/14/2019 1920   NA 140 01/10/2017 1819   K 3.5 07/14/2019 1920   CL 105 07/14/2019 1920   CO2 22 07/14/2019 1920   GLUCOSE 109 (H) 07/14/2019 1920   BUN 15 07/14/2019 1920   BUN 5 01/10/2017 1819   CREATININE 0.91 07/14/2019 1920   CALCIUM 10.0 07/14/2019 1920   PROT 8.8 (H) 07/14/2019 1920   PROT 7.0 01/10/2017 1819   ALBUMIN 5.4 (H) 07/14/2019 1920   ALBUMIN 4.3 01/10/2017 1819   AST 21 07/14/2019 1920   ALT 14 07/14/2019 1920   ALKPHOS 79 07/14/2019 1920   BILITOT 0.9 07/14/2019 1920   BILITOT <0.2 01/10/2017 1819   GFRNONAA >60 07/14/2019 1920   GFRAA >60 07/14/2019 1920       Component Value Date/Time   WBC 19.2 (H) 07/14/2019 1920   RBC 5.50 (H) 07/14/2019 1920   HGB 16.1 (H) 07/14/2019 1920   HGB 12.9 01/10/2017 1819   HCT 49.2 (H) 07/14/2019 1920   HCT 36.9 01/10/2017 1819   PLT 490 (H) 07/14/2019 1920   PLT 161 01/10/2017 1819   MCV 89.5 07/14/2019 1920   MCV 84 01/10/2017 1819   MCH 29.3 07/14/2019 1920   MCHC 32.7 07/14/2019 1920   RDW 12.0 07/14/2019 1920   RDW 13.7 01/10/2017 1819   LYMPHSABS 1.2 01/10/2017 1819   EOSABS 0.1 01/10/2017 1819   BASOSABS 0.0 01/10/2017 1819    No results found for: "POCLITH", "LITHIUM"   No results found for: "PHENYTOIN", "PHENOBARB", "VALPROATE", "CBMZ"   .res Assessment: Plan:    Plan:  Xanax 0.'5mg'$  TID prn anxiety   RTC 6 months   Patient advised to contact office with any questions, adverse effects, or acute worsening in signs and symptoms.  Discussed potential benefits, risk, and side effects of benzodiazepines to include potential risk of tolerance and dependence, as well as possible drowsiness. Advised patient not to drive if experiencing drowsiness and to take lowest possible  effective dose to minimize risk of dependence and tolerance.  Diagnoses and all orders for this visit:  Panic disorder  Generalized anxiety disorder -     ALPRAZolam (XANAX) 0.5 MG tablet; Take 1 tablet (0.5 mg total) by mouth 3 (three) times daily.  Major depression, single episode, in complete remission [facility])     Please see After Visit Summary for patient specific instructions.  No future appointments.   No orders of the defined types were placed in this encounter.   -------------------------------

## 2022-05-17 ENCOUNTER — Other Ambulatory Visit (HOSPITAL_COMMUNITY): Payer: Self-pay

## 2022-05-19 ENCOUNTER — Encounter: Payer: Self-pay | Admitting: Physician Assistant

## 2022-05-26 ENCOUNTER — Other Ambulatory Visit (HOSPITAL_COMMUNITY): Payer: Self-pay

## 2022-05-27 ENCOUNTER — Other Ambulatory Visit (HOSPITAL_COMMUNITY): Payer: Self-pay

## 2022-06-06 DIAGNOSIS — R635 Abnormal weight gain: Secondary | ICD-10-CM | POA: Diagnosis not present

## 2022-06-06 DIAGNOSIS — G5622 Lesion of ulnar nerve, left upper limb: Secondary | ICD-10-CM | POA: Diagnosis not present

## 2022-06-09 ENCOUNTER — Other Ambulatory Visit (HOSPITAL_COMMUNITY): Payer: Self-pay

## 2022-06-27 ENCOUNTER — Encounter: Payer: Self-pay | Admitting: *Deleted

## 2022-07-01 ENCOUNTER — Ambulatory Visit (INDEPENDENT_AMBULATORY_CARE_PROVIDER_SITE_OTHER): Payer: Commercial Managed Care - PPO | Admitting: Physician Assistant

## 2022-07-01 ENCOUNTER — Other Ambulatory Visit (INDEPENDENT_AMBULATORY_CARE_PROVIDER_SITE_OTHER): Payer: Commercial Managed Care - PPO

## 2022-07-01 ENCOUNTER — Encounter: Payer: Self-pay | Admitting: Physician Assistant

## 2022-07-01 ENCOUNTER — Other Ambulatory Visit (HOSPITAL_COMMUNITY): Payer: Self-pay

## 2022-07-01 VITALS — BP 98/72 | HR 110 | Ht 63.0 in | Wt 168.4 lb

## 2022-07-01 DIAGNOSIS — K625 Hemorrhage of anus and rectum: Secondary | ICD-10-CM | POA: Diagnosis not present

## 2022-07-01 DIAGNOSIS — K5900 Constipation, unspecified: Secondary | ICD-10-CM

## 2022-07-01 DIAGNOSIS — K6289 Other specified diseases of anus and rectum: Secondary | ICD-10-CM

## 2022-07-01 LAB — COMPREHENSIVE METABOLIC PANEL
ALT: 8 U/L (ref 0–35)
AST: 12 U/L (ref 0–37)
Albumin: 3.9 g/dL (ref 3.5–5.2)
Alkaline Phosphatase: 45 U/L (ref 39–117)
BUN: 12 mg/dL (ref 6–23)
CO2: 24 mEq/L (ref 19–32)
Calcium: 9.3 mg/dL (ref 8.4–10.5)
Chloride: 106 mEq/L (ref 96–112)
Creatinine, Ser: 0.84 mg/dL (ref 0.40–1.20)
GFR: 98.37 mL/min (ref >60.00)
Glucose, Bld: 94 mg/dL (ref 70–99)
Potassium: 4 mEq/L (ref 3.5–5.1)
Sodium: 137 mEq/L (ref 135–145)
Total Bilirubin: 0.4 mg/dL (ref 0.2–1.2)
Total Protein: 6.8 g/dL (ref 6.0–8.3)

## 2022-07-01 LAB — CBC WITH DIFFERENTIAL/PLATELET
Basophils Absolute: 0.1 10*3/uL (ref 0.0–0.1)
Basophils Relative: 1.1 % (ref 0.0–3.0)
Eosinophils Absolute: 0.2 10*3/uL (ref 0.0–0.7)
Eosinophils Relative: 2.5 % (ref 0.0–5.0)
HCT: 39.3 % (ref 36.0–46.0)
Hemoglobin: 13.5 g/dL (ref 12.0–15.0)
Lymphocytes Relative: 29.5 % (ref 12.0–46.0)
Lymphs Abs: 2.7 10*3/uL (ref 0.7–4.0)
MCHC: 34.2 g/dL (ref 30.0–36.0)
MCV: 87.4 fl (ref 78.0–100.0)
Monocytes Absolute: 0.5 10*3/uL (ref 0.1–1.0)
Monocytes Relative: 5.1 % (ref 3.0–12.0)
Neutro Abs: 5.6 10*3/uL (ref 1.4–7.7)
Neutrophils Relative %: 61.8 % (ref 43.0–77.0)
Platelets: 479 10*3/uL — ABNORMAL HIGH (ref 150.0–400.0)
RBC: 4.5 Mil/uL (ref 3.87–5.11)
RDW: 12.1 % (ref 11.5–15.5)
WBC: 9 10*3/uL (ref 4.0–10.5)

## 2022-07-01 LAB — IBC + FERRITIN
Ferritin: 17.9 ng/mL (ref 10.0–291.0)
Iron: 131 ug/dL (ref 42–145)
Saturation Ratios: 29.7 % (ref 20.0–50.0)
TIBC: 441 ug/dL (ref 250.0–450.0)
Transferrin: 315 mg/dL (ref 212.0–360.0)

## 2022-07-01 LAB — HIGH SENSITIVITY CRP: CRP, High Sensitivity: 4.34 mg/L (ref 0.000–5.000)

## 2022-07-01 LAB — SEDIMENTATION RATE: Sed Rate: 19 mm/hr (ref 0–20)

## 2022-07-01 MED ORDER — DILTIAZEM GEL 2 %: CUTANEOUS | 0 refills | Status: DC

## 2022-07-01 MED ORDER — DILTIAZEM GEL 2 %
CUTANEOUS | 0 refills | Status: DC
  Filled 2022-07-01: qty 30, fill #0

## 2022-07-01 NOTE — Progress Notes (Signed)
Chief Complaint: Rectal bleeding and abdominal pain  HPI:    Krystal Peters is a 22 year old female, assigned to Dr. Ardis Hughs, who was referred to me by Glenis Smoker, * for a complaint of rectal bleeding and abdominal pain.       01/11/2019 patient seen in clinic and described being constipated and straining for stools and noticing some streaking in her stool.  And had gotten worse.  Still continues with some constipation at that time.  At time of exam patient was found to have a posterior tender anal fissure and was prescribed nitroglycerin ointment 3 times daily x6-8 weeks.  Also Lidocaine ointment.  She was to start a stool softener 1-2 tabs of Colace or Dulcolax daily.  It was explained that if her pain and bleeding was not completely resolved within 2 months that she would need to follow in clinic.     03/20/2019 patient seen in clinic at that time and decreased Nitroglycerin ointment to twice a day since she had a bad headache and dizzy from this.  Still continued with occasional rectal pain and bleeding.  It was observed to be healing at that point.  Prescribed Diltiazem instead and told to continue with stool softener as well as Lidocaine as needed.  Told she would need a surgical referral if things did not get better.    Today, the patient tells me that she was okay for about 6 months after being seen last by me and using the Diltiazem but then all of her symptoms came back.  She has remained with chronic constipation, better if she uses MiraLAX on a daily basis but she only takes this every day for about a week at a time because on the bottle it tells her not to take it longer than that.  When she does use it she has more regular bowel movements but when she does not then she has to strain and it typically will hurt feeling like her fissure breaks open again with rectal pain.  Over the past 4 to 5 months she also describes an increase in blood in the toilet "it is everywhere".  Also some lower  abdominal pain before bowel movements typically relieved afterwards.    Patient's mother is a Marine scientist and made her come to this appointment.  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    Denies fever, chills, weight loss, nausea or vomiting.  Past Medical History:  Diagnosis Date   Anal fissure    Anxiety    Cystitis    Depression    History of UTI    PONV (postoperative nausea and vomiting)    Vitamin D deficiency     Past Surgical History:  Procedure Laterality Date   BREAST REDUCTION SURGERY Bilateral 08/11/2020   Procedure: MAMMARY REDUCTION  (BREAST);  Surgeon: Crissie Reese, MD;  Location: [facility];  Service: Plastics;  Laterality: Bilateral;   WISDOM TOOTH EXTRACTION      Current Outpatient Medications  Medication Sig Dispense Refill   ALPRAZolam (XANAX) 0.5 MG tablet Take 1 tablet (0.5 mg total) by mouth 3 (three) times daily. 90 tablet 2   NIKKI 3-0.02 MG tablet Take 1 tablet by mouth daily.     No current facility-administered medications for this visit.    Allergies as of 07/01/2022 - Review Complete 07/01/2022  Allergen Reaction Noted   Oxycodone Hives and Rash 09/09/2020   Shellfish allergy Other (See Comments) 05/08/2018    Family History  Problem  Relation Age of Onset   Colon polyps Maternal Aunt    Thyroid disease Maternal Grandmother    Bladder Cancer Maternal Grandmother    Heart disease Maternal Grandfather    Stroke Maternal Grandfather    Heart disease Paternal Grandmother    Colon cancer Neg Hx    Esophageal cancer Neg Hx    Pancreatic cancer Neg Hx    Stomach cancer Neg Hx     Social History   Socioeconomic History   Marital status: Single    Spouse name: Not on file   Number of children: Not on file   Years of education: Not on file   Highest education level: Not on file  Occupational History   Not on file  Tobacco Use   Smoking status: Never   Smokeless tobacco: Never  Vaping Use   Vaping Use: Never  used  Substance and Sexual Activity   Alcohol use: Yes    Alcohol/week: 3.0 standard drinks of alcohol    Types: 3 Glasses of wine per week   Drug use: No   Sexual activity: Never    Birth control/protection: None  Other Topics Concern   Not on file  Social History Narrative   Not on file   Social Determinants of Health   Financial Resource Strain: Not on file  Food Insecurity: Not on file  Transportation Needs: Not on file  Physical Activity: Not on file  Stress: Not on file  Social Connections: Not on file  Intimate Partner Violence: Not on file    Review of Systems:    Constitutional: No weight loss, fever or chills Skin: No rash  Cardiovascular: No chest pain Respiratory: No SOB  Gastrointestinal: See HPI and otherwise negative Genitourinary: No dysuria  Neurological: No headache, dizziness or syncope Musculoskeletal: No new muscle or joint pain Hematologic: No bruising Psychiatric: +anxiety   Physical Exam:  Vital signs: BP 98/72   Pulse (!) 110   Ht '5\' 3"'$  (1.6 m)   Wt 168 lb 6 oz (76.4 kg)   SpO2 98%   BMI 29.83 kg/m    Constitutional:   Pleasant Caucasian female appears to be in NAD, Well developed, Well nourished, alert and cooperative Head:  Normocephalic and atraumatic. Eyes:   PEERL, EOMI. No icterus. Conjunctiva pink. Ears:  Normal auditory acuity. Neck:  Supple Throat: Oral cavity and pharynx without inflammation, swelling or lesion.  Respiratory: Respirations even and unlabored. Lungs clear to auscultation bilaterally.   No wheezes, crackles, or rhonchi.  Cardiovascular: Normal S1, S2. No MRG. Regular rate and rhythm. No peripheral edema, cyanosis or pallor.  Gastrointestinal:  Soft, nondistended, nontender. No rebound or guarding. Normal bowel sounds. No appreciable masses or hepatomegaly. Rectal: External: Sentinel skin tag posteriorly, cannot get a great look but no visible fissure externally, unable to perform internal exam due to discomfort  and tight sphincter tone, patient declined anoscopy Msk:  Symmetrical without gross deformities. Without edema, no deformity or joint abnormality.  Neurologic:  Alert and  oriented x4;  grossly normal neurologically.  Skin:   Dry and intact without significant lesions or rashes. Psychiatric: Demonstrates good judgement and reason without abnormal affect or behaviors.  RELEVANT LABS AND IMAGING: CBC    Component Value Date/Time   WBC 19.2 (H) 07/14/2019 1920   RBC 5.50 (H) 07/14/2019 1920   HGB 16.1 (H) 07/14/2019 1920   HGB 12.9 01/10/2017 1819   HCT 49.2 (H) 07/14/2019 1920   HCT 36.9 01/10/2017 1819   PLT 490 (  H) 07/14/2019 1920   PLT 161 01/10/2017 1819   MCV 89.5 07/14/2019 1920   MCV 84 01/10/2017 1819   MCH 29.3 07/14/2019 1920   MCHC 32.7 07/14/2019 1920   RDW 12.0 07/14/2019 1920   RDW 13.7 01/10/2017 1819   LYMPHSABS 1.2 01/10/2017 1819   EOSABS 0.1 01/10/2017 1819   BASOSABS 0.0 01/10/2017 1819    CMP     Component Value Date/Time   NA 137 07/14/2019 1920   NA 140 01/10/2017 1819   K 3.5 07/14/2019 1920   CL 105 07/14/2019 1920   CO2 22 07/14/2019 1920   GLUCOSE 109 (H) 07/14/2019 1920   BUN 15 07/14/2019 1920   BUN 5 01/10/2017 1819   CREATININE 0.91 07/14/2019 1920   CALCIUM 10.0 07/14/2019 1920   PROT 8.8 (H) 07/14/2019 1920   PROT 7.0 01/10/2017 1819   ALBUMIN 5.4 (H) 07/14/2019 1920   ALBUMIN 4.3 01/10/2017 1819   AST 21 07/14/2019 1920   ALT 14 07/14/2019 1920   ALKPHOS 79 07/14/2019 1920   BILITOT 0.9 07/14/2019 1920   BILITOT <0.2 01/10/2017 1819   GFRNONAA >60 07/14/2019 1920   GFRAA >60 07/14/2019 1920    Assessment: 1.  Rectal bleeding and pain: Previously diagnosed with a fissure 3 to 4 years ago, improved with Diltiazem, now has come back over the past couple of years and seems to open every so often with increased pain and bleeding, unable to see fissure on exam today and unable to perform a anoscopy or internal exam due to tenderness;  consider most likely fissure +/- hemorrhoids versus proctitis or other 2.  Constipation: Chronic for the patient, better with MiraLAX, does have history of anxiety; thought IBS related in the past  Plan: 1.  Due to the fact that I could not get a good look on exam today and there was no visible fissure went ahead and scheduled patient for a colonoscopy given rectal bleeding and pain.  This was scheduled with Dr. Candis Schatz due to Dr. Ardis Hughs absence.  Did provide the patient a detailed list of risks for the procedure and she agrees to proceed. Patient is appropriate for endoscopic procedure(s) in the ambulatory ([facility]) setting.  2.  Recommend the patient use MiraLAX on a daily basis.  This seems to work well for her. 3.  Ordered labs today to include a CBC, CMP, iron studies, ESR and CRP 4.  Refilled Diltiazem 3 times daily for 6 to 8 weeks as well as Lidocaine as needed.  She can try this empirically while waiting for colonoscopy. 5.  Patient to follow clinic per recommendations after colonoscopy.  Note sent to Dr. Candis Schatz and lieu of Dr. Ardis Hughs absence.  Ellouise Newer, PA-C [facility] 07/01/2022, 10:44 AM  Cc: Glenis Smoker, *

## 2022-07-01 NOTE — Patient Instructions (Addendum)
You have been scheduled for a colonoscopy. Please follow written instructions given to you at your visit today.  Please pick up your prep supplies at the pharmacy within the next 1-3 days. If you use inhalers (even only as needed), please bring them with you on the day of your procedure.  Your provider has requested that you go to the basement level for lab work before leaving today. Press "B" on the elevator. The lab is located at the first door on the left as you exit the elevator.  We have sent the following medications to your pharmacy for you to pick up at your convenience: Diltiazem   Please purchase the following medications over the counter and take as directed: Miralax: please take one capful once a day  Lidocaine 5% rectal gel: use as needed for rectal pain  _______________________________________________________  If your blood pressure at your visit was 140/90 or greater, please contact your primary care physician to follow up on this.  _______________________________________________________  If you are age 23 or older, your body mass index should be between 23-30. Your Body mass index is 29.83 kg/m. If this is out of the aforementioned range listed, please consider follow up with your Primary Care Provider.  If you are age 73 or younger, your body mass index should be between 19-25. Your Body mass index is 29.83 kg/m. If this is out of the aformentioned range listed, please consider follow up with your Primary Care Provider.   ________________________________________________________  The [facility] providers would like to encourage you to use [facility] to communicate with providers for non-urgent requests or questions.  Due to long hold times on the telephone, sending your provider a message by [facility] may be a faster and more efficient way to get a response.  Please allow 48 business hours for a response.  Please remember that this is for non-urgent requests.   _______________________________________________________ It was a pleasure to see you today!  Thank you for trusting me with your gastrointestinal care!

## 2022-07-04 NOTE — Progress Notes (Signed)
Agree with the assessment and plan as outlined by Ellouise Newer, PA-C.  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  Iesha Summerhill E. Candis Schatz, MD

## 2022-07-08 ENCOUNTER — Other Ambulatory Visit (HOSPITAL_COMMUNITY): Payer: Self-pay

## 2022-07-08 ENCOUNTER — Telehealth: Payer: Self-pay | Admitting: Physician Assistant

## 2022-07-08 MED ORDER — NA SULFATE-K SULFATE-MG SULF 17.5-3.13-1.6 GM/177ML PO SOLN
1.0000 | Freq: Once | ORAL | 0 refills | Status: AC
  Filled 2022-07-08: qty 354, 1d supply, fill #0

## 2022-07-08 NOTE — Telephone Encounter (Signed)
Patient is calling states the pharmacy has not received her prep medication. [place]. Please advise

## 2022-07-08 NOTE — Telephone Encounter (Signed)
Script sent to pharmacy.

## 2022-07-09 ENCOUNTER — Other Ambulatory Visit (HOSPITAL_COMMUNITY): Payer: Self-pay

## 2022-07-11 ENCOUNTER — Ambulatory Visit (AMBULATORY_SURGERY_CENTER): Payer: Commercial Managed Care - PPO | Admitting: Gastroenterology

## 2022-07-11 ENCOUNTER — Encounter: Payer: Self-pay | Admitting: Gastroenterology

## 2022-07-11 VITALS — BP 100/61 | HR 77 | Temp 98.1°F | Resp 11 | Ht 63.0 in | Wt 168.0 lb

## 2022-07-11 DIAGNOSIS — K602 Anal fissure, unspecified: Secondary | ICD-10-CM | POA: Diagnosis not present

## 2022-07-11 DIAGNOSIS — K6289 Other specified diseases of anus and rectum: Secondary | ICD-10-CM | POA: Diagnosis not present

## 2022-07-11 DIAGNOSIS — K921 Melena: Secondary | ICD-10-CM | POA: Diagnosis not present

## 2022-07-11 DIAGNOSIS — F410 Panic disorder [episodic paroxysmal anxiety] without agoraphobia: Secondary | ICD-10-CM | POA: Diagnosis not present

## 2022-07-11 DIAGNOSIS — F418 Other specified anxiety disorders: Secondary | ICD-10-CM | POA: Diagnosis not present

## 2022-07-11 MED ORDER — SODIUM CHLORIDE 0.9 % IV SOLN: 500.0000 mL | Freq: Once | INTRAVENOUS | Status: DC

## 2022-07-11 NOTE — Progress Notes (Signed)
Pt resting comfortably. VSS. Airway intact. SBAR complete to RN. All questions answered.   

## 2022-07-11 NOTE — Patient Instructions (Addendum)
Handouts Provided:  Diverticulosis  Continue daily MiraLax to avoid hard stools/constipation  Consider botox injection vs sphincterotomy for further treatment of chronic anal fissure if symptoms persist.  Repeat colonoscopy at age 22 for screening purposes.  YOU HAD AN ENDOSCOPIC PROCEDURE TODAY AT [facility]:   Refer to the procedure report that was given to you for any specific questions about what was found during the examination.  If the procedure report does not answer your questions, please call your gastroenterologist to clarify.  If you requested that your care partner not be given the details of your procedure findings, then the procedure report has been included in a sealed envelope for you to review at your convenience later.  YOU SHOULD EXPECT: Some feelings of bloating in the abdomen. Passage of more gas than usual.  Walking can help get rid of the air that was put into your GI tract during the procedure and reduce the bloating. If you had a lower endoscopy (such as a colonoscopy or flexible sigmoidoscopy) you may notice spotting of blood in your stool or on the toilet paper. If you underwent a bowel prep for your procedure, you may not have a normal bowel movement for a few days.  Please Note:  You might notice some irritation and congestion in your nose or some drainage.  This is from the oxygen used during your procedure.  There is no need for concern and it should clear up in a day or so.  SYMPTOMS TO REPORT IMMEDIATELY:  Following lower endoscopy (colonoscopy or flexible sigmoidoscopy):  Excessive amounts of blood in the stool  Significant tenderness or worsening of abdominal pains  Swelling of the abdomen that is new, acute  Fever of 100F or higher  For urgent or emergent issues, a gastroenterologist can be reached at any hour by calling [phone]. Do not use MyChart messaging for urgent concerns.    DIET:  We do recommend a small meal at first,  but then you may proceed to your regular diet.  Drink plenty of fluids but you should avoid alcoholic beverages for 24 hours.  ACTIVITY:  You should plan to take it easy for the rest of today and you should NOT DRIVE or use heavy machinery until tomorrow (because of the sedation medicines used during the test).    FOLLOW UP: Our staff will call the number listed on your records the next business day following your procedure.  We will call around 7:15- 8:00 am to check on you and address any questions or concerns that you may have regarding the information given to you following your procedure. If we do not reach you, we will leave a message.     If any biopsies were taken you will be contacted by phone or by letter within the next 1-3 weeks.  Please call us at [phone] if you have not heard about the biopsies in 3 weeks.    SIGNATURES/CONFIDENTIALITY: You and/or your care partner have signed paperwork which will be entered into your electronic medical record.  These signatures attest to the fact that that the information above on your After Visit Summary has been reviewed and is understood.  Full responsibility of the confidentiality of this discharge information lies with you and/or your care-partner.

## 2022-07-11 NOTE — Progress Notes (Signed)
History and Physical Interval Note:  07/11/2022 3:15 PM  Krystal Peters  has presented today for endoscopic procedure(s), with the diagnosis of  Encounter Diagnosis  Name Primary?   Rectal pain Yes  .  The various methods of evaluation and treatment have been discussed with the patient and/or family. After consideration of risks, benefits and other options for treatment, the patient has consented to  the endoscopic procedure(s).   The patient's history has been reviewed, patient examined, no change in status, stable for endoscopic procedure(s).  I have reviewed the patient's chart and labs.  Questions were answered to the patient's satisfaction.     Dwayne Begay E. Candis Schatz, MD [facility]

## 2022-07-11 NOTE — Op Note (Signed)
[facility] Patient Name: Krystal Peters Procedure Date: 07/11/2022 3:26 PM MRN: [id number] Endoscopist: Nicki Reaper E. Candis Schatz , MD, [id number] Age: 22 Referring MD:  Date of Birth: Nov 18, 2000 Gender: Female Account #: [id number] Procedure:                Colonoscopy Indications:              Hematochezia Medicines:                Monitored Anesthesia Care Procedure:                Pre-Anesthesia Assessment:                           - Prior to the procedure, a History and Physical                            was performed, and patient medications and                            allergies were reviewed. The patient's tolerance of                            previous anesthesia was also reviewed. The risks                            and benefits of the procedure and the sedation                            options and risks were discussed with the patient.                            All questions were answered, and informed consent                            was obtained. Prior Anticoagulants: The patient has                            taken no anticoagulant or antiplatelet agents. ASA                            Grade Assessment: II - A patient with mild systemic                            disease. After reviewing the risks and benefits,                            the patient was deemed in satisfactory condition to                            undergo the procedure.                           After obtaining informed consent, the colonoscope  was passed under direct vision. Throughout the                            procedure, the patient's blood pressure, pulse, and                            oxygen saturations were monitored continuously. The                            Olympus PCF-[id number]) Colonoscope was                            introduced through the anus and advanced to the the                            terminal ileum, with identification of the                             appendiceal orifice and IC valve. The colonoscopy                            was performed without difficulty. The patient                            tolerated the procedure well. The quality of the                            bowel preparation was good. The terminal ileum,                            ileocecal valve, appendiceal orifice, and rectum                            were photographed. The bowel preparation used was                            SUPREP via split dose instruction. Scope In: 3:32:47 PM Scope Out: 3:45:45 PM Scope Withdrawal Time: 0 hours 9 minutes 18 seconds  Total Procedure Duration: 0 hours 12 minutes 58 seconds  Findings:                 An anal fissure was found on perianal exam.                           The digital rectal exam was normal. Pertinent                            negatives include normal sphincter tone and no                            palpable rectal lesions.                           A single small-mouthed diverticulum was found in  the descending colon.                           The colon (entire examined portion) appeared normal.                           The terminal ileum appeared normal.                           The retroflexed view of the distal rectum and anal                            verge was normal and showed no anal or rectal                            abnormalities. Complications:            No immediate complications. Estimated Blood Loss:     Estimated blood loss: none. Impression:               - Healing posterior midline anal fissure with small                            sentinel skin tag found on perianal exam.                           - Diverticulosis in the descending colon.                           - The entire examined colon is normal.                           - The examined portion of the ileum was normal.                           - The distal rectum and anal verge  are normal on                            retroflexion view.                           - No specimens collected.                           - No evidence of Crohn's disease and no other                            sources of hematochezia seen on endoscopy                           - The GI Genius (intelligent endoscopy module),                            computer-aided polyp detection system powered by AI  was utilized to detect colorectal polyps through                            enhanced visualization during colonoscopy. Recommendation:           - Patient has a contact number available for                            emergencies. The signs and symptoms of potential                            delayed complications were discussed with the                            patient. Return to normal activities tomorrow.                            Written discharge instructions were provided to the                            patient.                           - Resume previous diet.                           - Continue present medications.                           - Continue daily MiraLax to avoid hard                            stools/constipation                           - Consider botox injection vs sphincterotomy for                            further treatment of chronic anal fissure if                            symptoms persist.                           - Repeat colonoscopy at age 63 for screening                            purposes. Idalia Allbritton E. Candis Schatz, MD 07/11/2022 3:57:34 PM This report has been signed electronically.

## 2022-07-12 ENCOUNTER — Telehealth: Payer: Self-pay | Admitting: *Deleted

## 2022-07-12 NOTE — Telephone Encounter (Signed)
Follow up call attempt.  LVM to call if any questions or concerns. 

## 2022-07-14 ENCOUNTER — Other Ambulatory Visit: Payer: Self-pay

## 2022-08-26 ENCOUNTER — Other Ambulatory Visit (HOSPITAL_COMMUNITY): Payer: Self-pay

## 2022-08-27 ENCOUNTER — Emergency Department (HOSPITAL_BASED_OUTPATIENT_CLINIC_OR_DEPARTMENT_OTHER): Payer: Commercial Managed Care - PPO

## 2022-08-27 ENCOUNTER — Other Ambulatory Visit: Payer: Self-pay

## 2022-08-27 ENCOUNTER — Emergency Department (HOSPITAL_BASED_OUTPATIENT_CLINIC_OR_DEPARTMENT_OTHER)
Admission: EM | Admit: 2022-08-27 | Discharge: 2022-08-27 | Disposition: A | Discharge status: Home / Self Care | Payer: Commercial Managed Care - PPO | Attending: Emergency Medicine | Admitting: Emergency Medicine

## 2022-08-27 ENCOUNTER — Encounter (HOSPITAL_BASED_OUTPATIENT_CLINIC_OR_DEPARTMENT_OTHER): Payer: Self-pay

## 2022-08-27 ENCOUNTER — Other Ambulatory Visit (HOSPITAL_COMMUNITY): Payer: Self-pay

## 2022-08-27 DIAGNOSIS — D72829 Elevated white blood cell count, unspecified: Secondary | ICD-10-CM | POA: Insufficient documentation

## 2022-08-27 DIAGNOSIS — R000 Tachycardia, unspecified: Secondary | ICD-10-CM | POA: Diagnosis not present

## 2022-08-27 DIAGNOSIS — R569 Unspecified convulsions: Secondary | ICD-10-CM | POA: Diagnosis not present

## 2022-08-27 DIAGNOSIS — R55 Syncope and collapse: Secondary | ICD-10-CM | POA: Insufficient documentation

## 2022-08-27 LAB — BASIC METABOLIC PANEL
Anion gap: 8 (ref 5–15)
BUN: 11 mg/dL (ref 6–20)
CO2: 25 mmol/L (ref 22–32)
Calcium: 9.7 mg/dL (ref 8.9–10.3)
Chloride: 106 mmol/L (ref 98–111)
Creatinine, Ser: 0.8 mg/dL (ref 0.44–1.00)
GFR, Estimated: >60 mL/min (ref >60)
Glucose, Bld: 74 mg/dL (ref 70–99)
Potassium: 4.2 mmol/L (ref 3.5–5.1)
Sodium: 139 mmol/L (ref 135–145)

## 2022-08-27 LAB — CBC
HCT: 38.6 % (ref 36.0–46.0)
Hemoglobin: 12.8 g/dL (ref 12.0–15.0)
MCH: 29.6 pg (ref 26.0–34.0)
MCHC: 33.2 g/dL (ref 30.0–36.0)
MCV: 89.1 fL (ref 80.0–100.0)
Platelets: 403 10*3/uL — ABNORMAL HIGH (ref 150–400)
RBC: 4.33 MIL/uL (ref 3.87–5.11)
RDW: 12.7 % (ref 11.5–15.5)
WBC: 12.9 10*3/uL — ABNORMAL HIGH (ref 4.0–10.5)
nRBC: 0 % (ref 0.0–0.2)

## 2022-08-27 LAB — URINALYSIS, ROUTINE W REFLEX MICROSCOPIC
Bilirubin Urine: NEGATIVE
Glucose, UA: NEGATIVE mg/dL
Hgb urine dipstick: NEGATIVE
Ketones, ur: NEGATIVE mg/dL
Leukocytes,Ua: NEGATIVE
Nitrite: NEGATIVE
Protein, ur: NEGATIVE mg/dL
Specific Gravity, Urine: 1.022 (ref 1.005–1.030)
pH: 7 (ref 5.0–8.0)

## 2022-08-27 LAB — D-DIMER, QUANTITATIVE: D-Dimer, Quant: <0.27 ug/mL-FEU (ref 0.00–0.50)

## 2022-08-27 LAB — CBG MONITORING, ED: Glucose-Capillary: 67 mg/dL — ABNORMAL LOW (ref 70–99)

## 2022-08-27 LAB — PREGNANCY, URINE: Preg Test, Ur: NEGATIVE

## 2022-08-27 LAB — TSH: TSH: 2.196 u[IU]/mL (ref 0.350–4.500)

## 2022-08-27 MED ORDER — LACTATED RINGERS IV BOLUS
1000.0000 mL | Freq: Once | INTRAVENOUS | Status: AC
  Administered 2022-08-27: 1000 mL via INTRAVENOUS

## 2022-08-27 NOTE — ED Triage Notes (Signed)
She c/o three recent episodes of syncope. She denies any pain. She states these episodes are random in nature.

## 2022-08-27 NOTE — ED Notes (Signed)
Patient transported to CT 

## 2022-08-27 NOTE — Discharge Instructions (Signed)
Avoid driving until you have more answers to avoid hurting yourself or others, avoid swimming in a pool by yourself or riding a bicycle.  Make sure you are staying hydrated and eating and drinking normally.  All your test today look very reassuring.  Your EKG and your images were normal.  Continue to do what you are doing if you have the sensation make sure you lay down immediately.  Somebody from the cardiology office should call you for a follow-up appointment.

## 2022-08-27 NOTE — ED Provider Notes (Signed)
[facility] EMERGENCY DEPARTMENT AT [facility] Provider Note   CSN: [id number] Arrival date & time: 08/27/22  1558     History  Chief Complaint  Patient presents with   Loss of Consciousness    Krystal Peters is a 22 y.o. female.  Patient is a 22 year old female with a history of anxiety, depression, allergies and cystitis who is presenting today with complaint of several syncopal episodes.  She reports she has had 3 episodes in the last 2 weeks.  The most recent one was today.  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  She went outside and sat down but did report that she passed out for a few seconds.  The nurse at her clinicals checked her blood pressure and she reports her blood pressure, blood sugar and vital signs at that time were all normal.  The next episode happened a week later when she was in the car driving to her mom's house.  She reports that this 1 was a little bit different and that her vision started feeling weird like the cars were coming and at her.  She got to her mom's house and made it into the couch but then fell down on the couch and mom reports she was out for maybe 6 or segment seconds before waking up.  She was not confused after waking up she had no shaking, lipsmacking or biting her tongue.  And then the third episode happened today.  She was at work when she started getting the visual feeling again and then knew she needed to go sit down but did not make it.  She reports falling down on her knee and then slouching against the wall.  Again when she checked her blood pressure it was normal today.  She reports that after these episodes for 20 to 30 minutes afterwards her vision still seems off and she feels not quite herself.  She will have occasional headaches but denies constant headaches or headaches right before the episodes happen.  She has had no chest pain,  palpitations or shortness of breath.  No nausea or vomiting before or after the event.  She has only had 1 episode of syncope prior to these episodes and that was years ago with the blood draw.  She takes cetirizine but no other medications.  She vapes but denies drug use.  Only occasional alcohol use.  She has been eating and drinking normally but does report gaining 10 pounds in the last few months which she feels has been unwarranted based on her eating and lifestyle.  The history is provided by the patient and a parent.  Loss of Consciousness      Home Medications Prior to Admission medications   Medication Sig Start Date End Date Taking? Authorizing Provider  ALPRAZolam Prudy Feeler) 0.5 MG tablet Take 1 tablet (0.5 mg total) by mouth 3 (three) times daily. 05/16/22   Mozingo, Thereasa Solo, NP  diltiazem 2 % GEL Using your index finger, apply a small amount of medication inside the rectum up to your first knuckle/joint 3 times daily x 6- 8 weeks. 07/01/22   Unk Lightning, PA      Allergies    Oxycodone and Shellfish allergy    Review of Systems   Review of Systems  Cardiovascular:  Positive for syncope.    Physical Exam Updated Vital Signs BP (!) 136/91 (  BP Location: Right Arm)   Pulse (!) 107   Temp 98.6 F (37 C) (Oral)   Resp 16   LMP 08/01/2022 (Exact Date)   SpO2 100%  Physical Exam Vitals and nursing note reviewed.  Constitutional:      General: She is not in acute distress.    Appearance: She is well-developed.  HENT:     Head: Normocephalic and atraumatic.  Eyes:     Pupils: Pupils are equal, round, and reactive to light.  Cardiovascular:     Rate and Rhythm: Regular rhythm. Tachycardia present.     Heart sounds: Normal heart sounds. No murmur heard.    No friction rub.  Pulmonary:     Effort: Pulmonary effort is normal.     Breath sounds: Normal breath sounds. No wheezing or rales.  Abdominal:     General: Bowel sounds are normal. There is no  distension.     Palpations: Abdomen is soft.     Tenderness: There is no abdominal tenderness. There is no guarding or rebound.  Musculoskeletal:        General: No tenderness. Normal range of motion.     Comments: No edema  Skin:    General: Skin is warm and dry.     Findings: No rash.  Neurological:     Mental Status: She is alert and oriented to person, place, and time.     Cranial Nerves: No cranial nerve deficit, dysarthria or facial asymmetry.     Sensory: Sensation is intact.     Motor: Motor function is intact. No weakness.     Coordination: Coordination is intact. Finger-Nose-Finger Test and Heel to [place] Test normal.     Gait: Gait is intact.  Psychiatric:        Behavior: Behavior normal.     ED Results / Procedures / Treatments   Labs (all labs ordered are listed, but only abnormal results are displayed) Labs Reviewed  CBC - Abnormal; Notable for the following components:      Result Value   WBC 12.9 (*)    Platelets 403 (*)    All other components within normal limits  CBG MONITORING, ED - Abnormal; Notable for the following components:   Glucose-Capillary 67 (*)    All other components within normal limits  BASIC METABOLIC PANEL  URINALYSIS, ROUTINE W REFLEX MICROSCOPIC  PREGNANCY, URINE  TSH  D-DIMER, QUANTITATIVE    EKG EKG Interpretation  Date/Time:  Saturday August 27 2022 16:26:10 EDT Ventricular Rate:  111 PR Interval:  159 QRS Duration: 90 QT Interval:  331 QTC Calculation: 450 R Axis:   87 Text Interpretation: Sinus tachycardia No previous tracing Confirmed by Gwyneth Sprout ([id number]) on 08/27/2022 4:38:32 PM  Radiology DG Chest Port 1 View  Result Date: 08/27/2022 CLINICAL DATA:  Syncope EXAM: PORTABLE CHEST 1 VIEW COMPARISON:  None Available. FINDINGS: The heart size and mediastinal contours are within normal limits. Both lungs are clear. The visualized skeletal structures are unremarkable. IMPRESSION: No active disease. Electronically  Signed   By: Gerome Sam III M.D.   On: 08/27/2022 16:59   CT Head Wo Contrast  Result Date: 08/27/2022 CLINICAL DATA:  Seizure, new-onset, no history of trauma EXAM: CT HEAD WITHOUT CONTRAST TECHNIQUE: Contiguous axial images were obtained from the base of the skull through the vertex without intravenous contrast. RADIATION DOSE REDUCTION: This exam was performed according to the departmental dose-optimization program which includes automated exposure control, adjustment of the mA and/or kV according to  patient size and/or use of iterative reconstruction technique. COMPARISON:  None Available. FINDINGS: Brain: No evidence of large-territorial acute infarction. No parenchymal hemorrhage. No mass lesion. No extra-axial collection. No mass effect or midline shift. No hydrocephalus. Basilar cisterns are patent. Vascular: No hyperdense vessel. Skull: No acute fracture or focal lesion. Sinuses/Orbits: Paranasal sinuses and mastoid air cells are clear. The orbits are unremarkable. Other: None. IMPRESSION: No acute intracranial abnormality. Electronically Signed   By: Tish Frederickson M.D.   On: 08/27/2022 16:56    Procedures Procedures    Medications Ordered in ED Medications  lactated ringers bolus 1,000 mL (0 mLs Intravenous Stopped 08/27/22 1822)    ED Course/ Medical Decision Making/ A&P                             Medical Decision Making Amount and/or Complexity of Data Reviewed Independent Historian: parent    Details: mom External Data Reviewed: notes. Labs: ordered. Decision-making details documented in ED Course. Radiology: ordered and independent interpretation performed. Decision-making details documented in ED Course. ECG/medicine tests: ordered and independent interpretation performed. Decision-making details documented in ED Course.   Pt with multiple medical problems and comorbidities and presenting today with a complaint that caries a high risk for morbidity and mortality.   Patient here today with episodes of losing consciousness.  Unclear if patient is having syncope versus atypical seizures.  She does have prodromal symptoms and then has symptoms afterwards of vision feeling off for 30 minutes.  She has no chest pain or shortness of breath.  She denies that medications that would be causing the issue and seems to be eating and drinking normally.  No recent illnesses.  Patient has been off OCPs for several months and has not had any immobilization or unilateral leg pain or swelling suggestive of PE.  I dependently interpreted patient's EKG and labs.  EKG shows a sinus tachycardia but no prolonged QT, evidence of Brugada's or WPW.  TSH, BMP, UA and urine pregnancy are all within normal limits.  CBC with leukocytosis of 12 but otherwise normal. I have independently visualized and interpreted pt's images today.  Chest x-ray and head CT without acute findings.  7:06 PM On reevaluation patient has persistent tachycardia.  Given her persistent tachycardia we will do a D-dimer to ensure that it is not clot causing her symptoms.  7:06 PM D-dimer negative patient still feeling her normal self.  At this time feel that patient is stable for discharge home but will do ambulatory referral to cardiology.  She has a follow-up with her ECP on Tuesday and discussed with her if all cardiology stuff is negative she may need to see a neurologist.  She is comfortable with this plan.  Discussed with her safety such as avoiding driving until she has more definitive answers, avoiding riding bikes or scooters or being in a swimming pool by herself.         Final Clinical Impression(s) / ED Diagnoses Final diagnoses:  Syncope, unspecified syncope type    Rx / DC Orders ED Discharge Orders          Ordered    Ambulatory referral to Cardiology        08/27/22 Rosana Fret, MD 08/27/22 1906

## 2022-09-06 ENCOUNTER — Encounter: Payer: Self-pay | Admitting: Adult Health

## 2022-09-06 ENCOUNTER — Ambulatory Visit (INDEPENDENT_AMBULATORY_CARE_PROVIDER_SITE_OTHER): Payer: Commercial Managed Care - PPO | Admitting: Adult Health

## 2022-09-06 ENCOUNTER — Other Ambulatory Visit (HOSPITAL_COMMUNITY): Payer: Self-pay

## 2022-09-06 DIAGNOSIS — F325 Major depressive disorder, single episode, in full remission: Secondary | ICD-10-CM

## 2022-09-06 DIAGNOSIS — F411 Generalized anxiety disorder: Secondary | ICD-10-CM | POA: Diagnosis not present

## 2022-09-06 DIAGNOSIS — F410 Panic disorder [episodic paroxysmal anxiety] without agoraphobia: Secondary | ICD-10-CM

## 2022-09-06 MED ORDER — ALPRAZOLAM 0.5 MG PO TABS
0.5000 mg | ORAL_TABLET | Freq: Three times a day (TID) | ORAL | 2 refills | Status: DC
Start: 2022-09-06 — End: 2022-11-24
  Filled 2022-09-06 – 2022-09-28 (×2): qty 90, 30d supply, fill #0
  Filled 2022-10-28: qty 90, 30d supply, fill #1

## 2022-09-06 NOTE — Progress Notes (Addendum)
Krystal Peters [id number] 11-25-2000 21 y.o.  Subjective:   Patient ID:  Krystal Peters is a 22 y.o. (DOB November 22, 2000) female.  Chief Complaint: No chief complaint on file.   HPI Krystal Peters presents to the office today for follow-up of depression, anxiety and panic attacks.  Describes mood today as "not the best". Pleasant. Tearful at times. Mood symptoms - denies depression - gets discouraged. Denies irritability. Reports anxiety. Reports worry, rumination, and over thinking. Reports panic attacks - once or twice a week. Mood is consistent. Reports possible hormonal fluctuations prior to menstrual cycle causing increased anxiety and panic attacks. Stable interest and motivation. Taking medications as prescribed. Energy levels stable. Active, has a regular exercise routine. Going to the gym - 2 to 3 times a week.  Enjoys some usual interests and activities. Single. Student. Lives with 2 room-mates. Mother in [place]. Father in [place]. Spending time with family. Appetite adequate. Weight gain - 160 to 165 pounds. Sleeps well most nights. Averages 9 to 10 hours. Focus and concentration stable. Completing tasks. Managing aspects of household. 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  Denies AH or VH. Denies self harm. Denies substance use.  Previous medication trials: Lexapro, Xanax, Zoloft     PHQ2-9    Flowsheet Row Office Visit from 01/10/2017 in [facility] Office Visit from 10/04/2016 in [facility] Office Visit from 09/15/2016 in [facility] Office Visit from 06/07/2016 in [facility] Office Visit from 12/30/2015 in [facility]  PHQ-2 Total Score 0 0 0 0 0  PHQ-9 Total Score -- -- -- 0 0      Flowsheet Row ED from 08/27/2022 in [facility] Emergency Department  at [facility] ED from 10/06/2021 in [facility] Emergency Department at [facility] Admission (Discharged) from 08/11/2020 in [facility]  C-SSRS RISK CATEGORY No Risk No Risk No Risk        Review of Systems:  Review of Systems  Musculoskeletal:  Negative for gait problem.  Neurological:  Negative for tremors.  Psychiatric/Behavioral:         Please refer to HPI    Medications: I have reviewed the patient's current medications.  Current Outpatient Medications  Medication Sig Dispense Refill   ALPRAZolam (XANAX) 0.5 MG tablet Take 1 tablet (0.5 mg total) by mouth 3 (three) times daily. 90 tablet 2   diltiazem 2 % GEL Using your index finger, apply a small amount of medication inside the rectum up to your first knuckle/joint 3 times daily x 6- 8 weeks. 30 g 0   No current facility-administered medications for this visit.    Medication Side Effects: None  Allergies:  Allergies  Allergen Reactions   Oxycodone Hives and Rash   Shellfish Allergy Other (See Comments)    Unknown-not specifically determined Unknown-not specifically determined    Past Medical History:  Diagnosis Date   Anal fissure    Anxiety    Cystitis    Depression    History of UTI    PONV (postoperative nausea and vomiting)    Vitamin D deficiency     Past Medical History, Surgical history, Social history, and Family history were reviewed and updated as appropriate.   Please see review of systems for further details on the patient's review from today.   Objective:   Physical Exam:  LMP 08/01/2022 (Exact Date)   Physical Exam Constitutional:      General: She is not in acute distress. Musculoskeletal:        General: No deformity.  Neurological:     Mental Status: She is alert and oriented to person, place, and time.     Coordination: Coordination normal.  Psychiatric:        Attention and Perception: Attention and perception normal. She does not perceive auditory or visual  hallucinations.        Mood and Affect: Mood normal. Mood is not anxious or depressed. Affect is not labile, blunt, angry or inappropriate.        Speech: Speech normal.        Behavior: Behavior normal.        Thought Content: Thought content normal. Thought content is not paranoid or delusional. Thought content does not include homicidal or suicidal ideation. Thought content does not include homicidal or suicidal plan.        Cognition and Memory: Cognition and memory normal.        Judgment: Judgment normal.     Comments: Insight intact     Lab Review:     Component Value Date/Time   NA 139 08/27/2022 1624   NA 140 01/10/2017 1819   K 4.2 08/27/2022 1624   CL 106 08/27/2022 1624   CO2 25 08/27/2022 1624   GLUCOSE 74 08/27/2022 1624   BUN 11 08/27/2022 1624   BUN 5 01/10/2017 1819   CREATININE 0.80 08/27/2022 1624   CALCIUM 9.7 08/27/2022 1624   PROT 6.8 07/01/2022 1119   PROT 7.0 01/10/2017 1819   ALBUMIN 3.9 07/01/2022 1119   ALBUMIN 4.3 01/10/2017 1819   AST 12 07/01/2022 1119   ALT 8 07/01/2022 1119   ALKPHOS 45 07/01/2022 1119   BILITOT 0.4 07/01/2022 1119   BILITOT <0.2 01/10/2017 1819   GFRNONAA >60 08/27/2022 1624   GFRAA >60 07/14/2019 1920       Component Value Date/Time   WBC 12.9 (H) 08/27/2022 1624   RBC 4.33 08/27/2022 1624   HGB 12.8 08/27/2022 1624   HGB 12.9 01/10/2017 1819   HCT 38.6 08/27/2022 1624   HCT 36.9 01/10/2017 1819   PLT 403 (H) 08/27/2022 1624   PLT 161 01/10/2017 1819   MCV 89.1 08/27/2022 1624   MCV 84 01/10/2017 1819   MCH 29.6 08/27/2022 1624   MCHC 33.2 08/27/2022 1624   RDW 12.7 08/27/2022 1624   RDW 13.7 01/10/2017 1819   LYMPHSABS 2.7 07/01/2022 1119   LYMPHSABS 1.2 01/10/2017 1819   MONOABS 0.5 07/01/2022 1119   EOSABS 0.2 07/01/2022 1119   EOSABS 0.1 01/10/2017 1819   BASOSABS 0.1 07/01/2022 1119   BASOSABS 0.0 01/10/2017 1819    No results found for: "POCLITH", "LITHIUM"   No results found for: "PHENYTOIN",  "PHENOBARB", "VALPROATE", "CBMZ"   .res Assessment: Plan:    Plan:  Xanax 0.5mg  TID prn anxiety   Consider SSRI next visit  Consider NAC for OCD thoughts  RTC 6 months   Patient advised to contact office with any questions, adverse effects, or acute worsening in signs and symptoms.  Discussed potential benefits, risk, and side effects of benzodiazepines to include potential risk of tolerance and dependence, as well as possible drowsiness. Advised patient not to drive if experiencing drowsiness and to take lowest possible effective dose to minimize risk of dependence and tolerance.  There are no diagnoses linked to this encounter.   Please see  After Visit Summary for patient specific instructions.  Future Appointments  Date Time Provider Department Center  10/21/2022  9:40 AM Jodelle Red, MD [facility]    No orders of the defined types were placed in this encounter.   -------------------------------

## 2022-09-06 NOTE — Addendum Note (Signed)
Addended by: Dorothyann Gibbs on: 09/06/2022 10:14 AM   Modules accepted: Orders

## 2022-09-28 ENCOUNTER — Other Ambulatory Visit: Payer: Self-pay

## 2022-09-28 ENCOUNTER — Other Ambulatory Visit (HOSPITAL_COMMUNITY): Payer: Self-pay

## 2022-10-21 ENCOUNTER — Ambulatory Visit (HOSPITAL_BASED_OUTPATIENT_CLINIC_OR_DEPARTMENT_OTHER): Payer: Commercial Managed Care - PPO | Admitting: Cardiology

## 2022-10-28 ENCOUNTER — Other Ambulatory Visit (HOSPITAL_COMMUNITY): Payer: Self-pay

## 2022-10-28 ENCOUNTER — Other Ambulatory Visit: Payer: Self-pay

## 2022-10-29 ENCOUNTER — Other Ambulatory Visit (HOSPITAL_COMMUNITY): Payer: Self-pay

## 2022-11-07 ENCOUNTER — Ambulatory Visit: Payer: Commercial Managed Care - PPO | Admitting: Adult Health

## 2022-11-10 ENCOUNTER — Other Ambulatory Visit: Payer: Self-pay | Admitting: Oncology

## 2022-11-10 DIAGNOSIS — Z006 Encounter for examination for normal comparison and control in clinical research program: Secondary | ICD-10-CM

## 2022-11-14 ENCOUNTER — Ambulatory Visit: Payer: Commercial Managed Care - PPO | Admitting: Adult Health

## 2022-11-24 ENCOUNTER — Ambulatory Visit (INDEPENDENT_AMBULATORY_CARE_PROVIDER_SITE_OTHER): Payer: Commercial Managed Care - PPO | Admitting: Adult Health

## 2022-11-24 ENCOUNTER — Encounter: Payer: Self-pay | Admitting: Adult Health

## 2022-11-24 DIAGNOSIS — F410 Panic disorder [episodic paroxysmal anxiety] without agoraphobia: Secondary | ICD-10-CM | POA: Diagnosis not present

## 2022-11-24 DIAGNOSIS — F411 Generalized anxiety disorder: Secondary | ICD-10-CM | POA: Diagnosis not present

## 2022-11-24 DIAGNOSIS — F325 Major depressive disorder, single episode, in full remission: Secondary | ICD-10-CM | POA: Diagnosis not present

## 2022-11-24 MED ORDER — ESCITALOPRAM OXALATE 10 MG PO TABS
10.0000 mg | ORAL_TABLET | Freq: Every day | ORAL | 5 refills | Status: DC
Start: 2022-11-24 — End: 2022-12-22

## 2022-11-24 MED ORDER — ALPRAZOLAM 0.5 MG PO TABS
0.5000 mg | ORAL_TABLET | Freq: Three times a day (TID) | ORAL | 2 refills | Status: DC
Start: 2022-11-24 — End: 2022-12-26

## 2022-11-24 NOTE — Progress Notes (Signed)
Krystal Peters [id number] 2000/12/07 22 y.o.  Subjective:   Patient ID:  Krystal Peters is a 22 y.o. (DOB 07-13-2000) female.  Chief Complaint: No chief complaint on file.   HPI Krystal Peters presents to the office today for follow-up of depression, anxiety and panic attacks.  Describes mood today as "ok". Pleasant. Tearful at times. Mood symptoms - denies depression. Reports irritability at times. Reports anxiety. Reports worry, rumination, and over thinking. Reports routines and rituals. Reports panic attacks - once or twice a week. Mood is consistent - anxious throughout the day. Stable interest and motivation. Taking medications as prescribed. Energy levels stable. Active, has a regular exercise routine. Going to the gym - 2 to 3 times a week.  Enjoys some usual interests and activities. Single - has a boyfriend of 3 months. Student. Lives with 2 room-mates. Mother in [place]. Father in [place]. Spending time with family. Appetite adequate. Weight loss - 165 pounds. Sleeping better some nights than others. Waking up during the night. Averages 6 to 7 hours. Focus and concentration stable. Completing tasks. Managing aspects of household. 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  Denies AH or VH. Denies self harm. Denies substance use.  Previous medication trials: Lexapro, Xanax, Zoloft    PHQ2-9    Flowsheet Row Office Visit from 01/10/2017 in [facility] Office Visit from 10/04/2016 in [facility] Office Visit from 09/15/2016 in [facility] Office Visit from 06/07/2016 in [facility] Office Visit from 12/30/2015 in [facility]  PHQ-2 Total Score 0 0 0 0 0  PHQ-9 Total Score -- -- -- 0 0      Flowsheet Row ED from 08/27/2022 in [facility] Emergency Department at  [facility] ED from 10/06/2021 in [facility] Emergency Department at [facility] Admission (Discharged) from 08/11/2020 in [facility]  C-SSRS RISK CATEGORY No Risk No Risk No Risk        Review of Systems:  Review of Systems  Musculoskeletal:  Negative for gait problem.  Neurological:  Negative for tremors.  Psychiatric/Behavioral:         Please refer to HPI    Medications: I have reviewed the patient's current medications.  Current Outpatient Medications  Medication Sig Dispense Refill   ALPRAZolam (XANAX) 0.5 MG tablet Take 1 tablet (0.5 mg total) by mouth 3 (three) times daily. 90 tablet 2   diltiazem 2 % GEL Using your index finger, apply a small amount of medication inside the rectum up to your first knuckle/joint 3 times daily x 6- 8 weeks. 30 g 0   No current facility-administered medications for this visit.    Medication Side Effects: None  Allergies:  Allergies  Allergen Reactions   Oxycodone Hives and Rash   Shellfish Allergy Other (See Comments)    Unknown-not specifically determined Unknown-not specifically determined    Past Medical History:  Diagnosis Date   Anal fissure    Anxiety    Cystitis    Depression    History of UTI    PONV (postoperative nausea and vomiting)    Vitamin D deficiency     Past Medical History, Surgical history, Social history, and Family history were reviewed and updated as appropriate.   Please see review of systems for further details on the patient's review from today.   Objective:  Physical Exam:  There were no vitals taken for this visit.  Physical Exam Constitutional:      General: She is not in acute distress. Musculoskeletal:        General: No deformity.  Neurological:     Mental Status: She is alert and oriented to person, place, and time.     Coordination: Coordination normal.  Psychiatric:        Attention and Perception: Attention and perception normal. She does not perceive auditory or  visual hallucinations.        Mood and Affect: Affect is not labile, blunt, angry or inappropriate.        Speech: Speech normal.        Behavior: Behavior normal.        Thought Content: Thought content normal. Thought content is not paranoid or delusional. Thought content does not include homicidal or suicidal ideation. Thought content does not include homicidal or suicidal plan.        Cognition and Memory: Cognition and memory normal.        Judgment: Judgment normal.     Comments: Insight intact     Lab Review:     Component Value Date/Time   NA 139 08/27/2022 1624   NA 140 01/10/2017 1819   K 4.2 08/27/2022 1624   CL 106 08/27/2022 1624   CO2 25 08/27/2022 1624   GLUCOSE 74 08/27/2022 1624   BUN 11 08/27/2022 1624   BUN 5 01/10/2017 1819   CREATININE 0.80 08/27/2022 1624   CALCIUM 9.7 08/27/2022 1624   PROT 6.8 07/01/2022 1119   PROT 7.0 01/10/2017 1819   ALBUMIN 3.9 07/01/2022 1119   ALBUMIN 4.3 01/10/2017 1819   AST 12 07/01/2022 1119   ALT 8 07/01/2022 1119   ALKPHOS 45 07/01/2022 1119   BILITOT 0.4 07/01/2022 1119   BILITOT <0.2 01/10/2017 1819   GFRNONAA >60 08/27/2022 1624   GFRAA >60 07/14/2019 1920       Component Value Date/Time   WBC 12.9 (H) 08/27/2022 1624   RBC 4.33 08/27/2022 1624   HGB 12.8 08/27/2022 1624   HGB 12.9 01/10/2017 1819   HCT 38.6 08/27/2022 1624   HCT 36.9 01/10/2017 1819   PLT 403 (H) 08/27/2022 1624   PLT 161 01/10/2017 1819   MCV 89.1 08/27/2022 1624   MCV 84 01/10/2017 1819   MCH 29.6 08/27/2022 1624   MCHC 33.2 08/27/2022 1624   RDW 12.7 08/27/2022 1624   RDW 13.7 01/10/2017 1819   LYMPHSABS 2.7 07/01/2022 1119   LYMPHSABS 1.2 01/10/2017 1819   MONOABS 0.5 07/01/2022 1119   EOSABS 0.2 07/01/2022 1119   EOSABS 0.1 01/10/2017 1819   BASOSABS 0.1 07/01/2022 1119   BASOSABS 0.0 01/10/2017 1819    No results found for: "POCLITH", "LITHIUM"   No results found for: "PHENYTOIN", "PHENOBARB", "VALPROATE", "CBMZ"    .res Assessment: Plan:    Plan:  Xanax 0.5mg  TID prn anxiety   Add Lexapro 10mg  daily.  Consider NAC for OCD thoughts  RTC 6 months   Patient advised to contact office with any questions, adverse effects, or acute worsening in signs and symptoms.  Discussed potential benefits, risk, and side effects of benzodiazepines to include potential risk of tolerance and dependence, as well as possible drowsiness. Advised patient not to drive if experiencing drowsiness and to take lowest possible effective dose to minimize risk of dependence and tolerance. There are no diagnoses linked to this encounter.   Please see After Visit Summary  for patient specific instructions.  Future Appointments  Date Time Provider Department Center  11/24/2022  5:00 PM Delora Gravatt, Thereasa Solo, NP [facility] None    No orders of the defined types were placed in this encounter.   -------------------------------

## 2022-12-20 ENCOUNTER — Other Ambulatory Visit: Payer: Self-pay | Admitting: Adult Health

## 2022-12-20 DIAGNOSIS — F325 Major depressive disorder, single episode, in full remission: Secondary | ICD-10-CM

## 2022-12-26 ENCOUNTER — Other Ambulatory Visit: Payer: Self-pay

## 2022-12-26 ENCOUNTER — Telehealth: Payer: Self-pay | Admitting: Adult Health

## 2022-12-26 DIAGNOSIS — F411 Generalized anxiety disorder: Secondary | ICD-10-CM

## 2022-12-26 DIAGNOSIS — F410 Panic disorder [episodic paroxysmal anxiety] without agoraphobia: Secondary | ICD-10-CM

## 2022-12-26 DIAGNOSIS — F325 Major depressive disorder, single episode, in full remission: Secondary | ICD-10-CM

## 2022-12-26 MED ORDER — ESCITALOPRAM OXALATE 10 MG PO TABS
10.0000 mg | ORAL_TABLET | Freq: Every day | ORAL | 1 refills | Status: DC
Start: 2022-12-26 — End: 2023-06-15
  Filled 2022-12-26: qty 90, 90d supply, fill #0
  Filled 2023-04-29 (×2): qty 90, 90d supply, fill #1

## 2022-12-26 NOTE — Telephone Encounter (Signed)
Pt called and said that she is a [employer] employee and her lexapro and xanax need to be sent to [facility] long outpatient pharmacy.Please cancel at [facility]

## 2022-12-26 NOTE — Telephone Encounter (Signed)
Still need to cancel at [facility].

## 2022-12-26 NOTE — Telephone Encounter (Signed)
LF alprazolam 7/29.  Canceled scripts at [facility] and sent/pended to [facility].

## 2022-12-27 ENCOUNTER — Other Ambulatory Visit: Payer: Self-pay

## 2022-12-27 ENCOUNTER — Other Ambulatory Visit (HOSPITAL_COMMUNITY): Payer: Self-pay

## 2022-12-27 MED ORDER — ALPRAZOLAM 0.5 MG PO TABS
0.5000 mg | ORAL_TABLET | Freq: Three times a day (TID) | ORAL | 2 refills | Status: DC
Start: 2022-12-27 — End: 2023-04-03
  Filled 2022-12-27: qty 90, 30d supply, fill #0
  Filled 2023-01-31: qty 90, 30d supply, fill #1
  Filled 2023-03-03: qty 90, 30d supply, fill #2

## 2022-12-27 NOTE — Telephone Encounter (Signed)
Canceled at [facility]

## 2022-12-28 ENCOUNTER — Other Ambulatory Visit (HOSPITAL_COMMUNITY): Payer: Self-pay

## 2023-01-31 ENCOUNTER — Other Ambulatory Visit (HOSPITAL_COMMUNITY): Payer: Self-pay

## 2023-01-31 ENCOUNTER — Other Ambulatory Visit: Payer: Self-pay

## 2023-02-11 IMAGING — CT CT NECK W/ CM
4 of 5 series · 13 of 33 positions shown, 15 images · IV contrast (APPLIED)
Comparison: None Available.

CLINICAL DATA: Soft tissue swelling with infection suspected. Sore
throat for 1 week with negative strep test

EXAM:
CT NECK WITH CONTRAST
TECHNIQUE: Multidetector CT imaging of the neck was performed using the
standard protocol following the bolus administration of intravenous
contrast.

[Series 3: ax bone · axial · 0.54mm/px · z∈[+844,+944]mm · 3 of 102 slices shown, 4 images]
[im 26/102  soft-tissue]
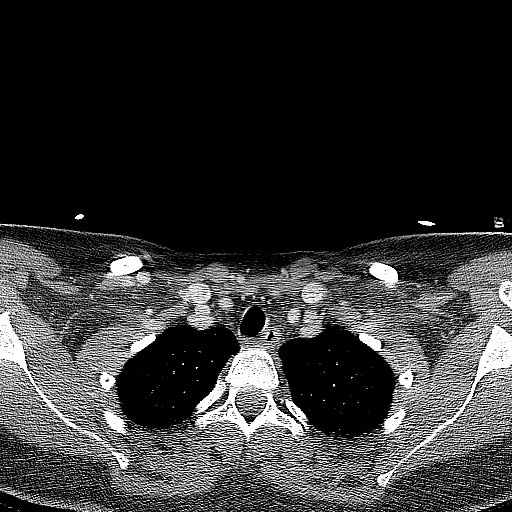
[im 26/102  bone]
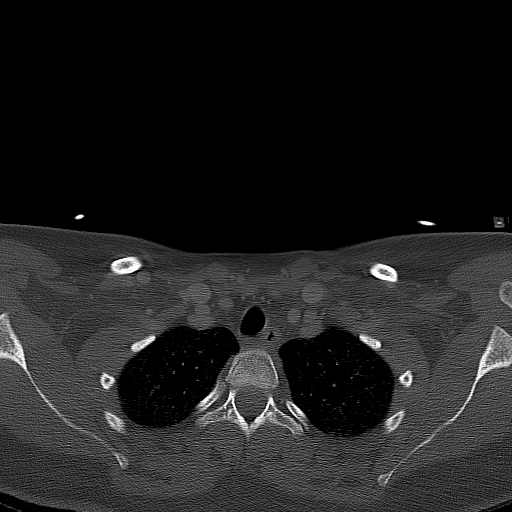
[im 51/102  bone]
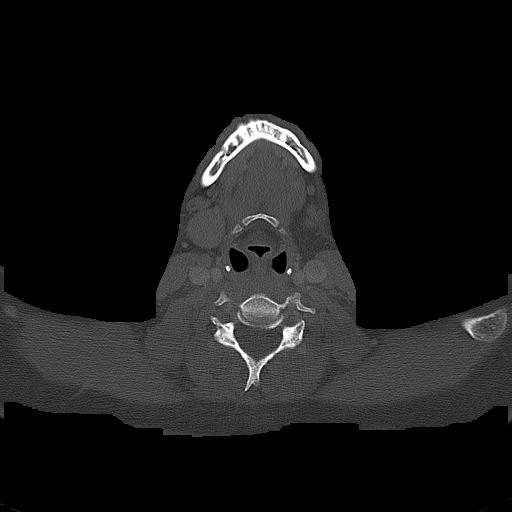
[im 76/102  bone]
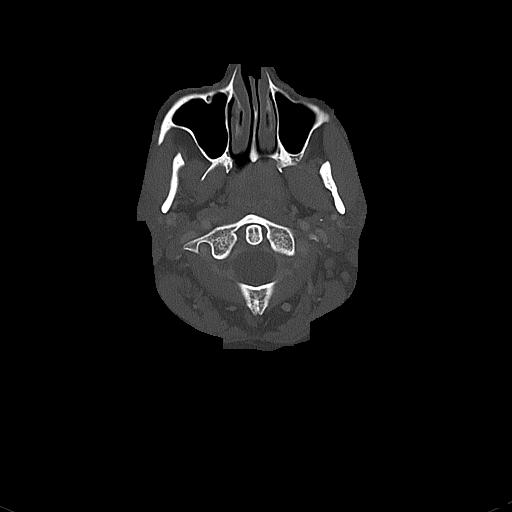

[Series 4: cor neck · coronal · 0.35mm/px · 3 of 140 slices shown]
[im 28/140  bone]
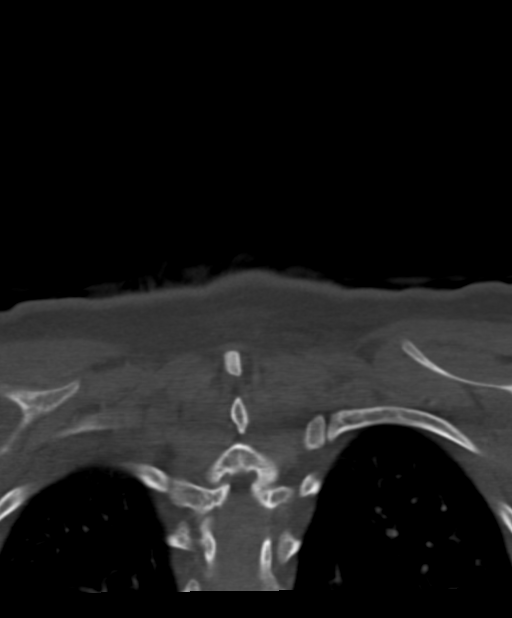
[im 56/140  bone]
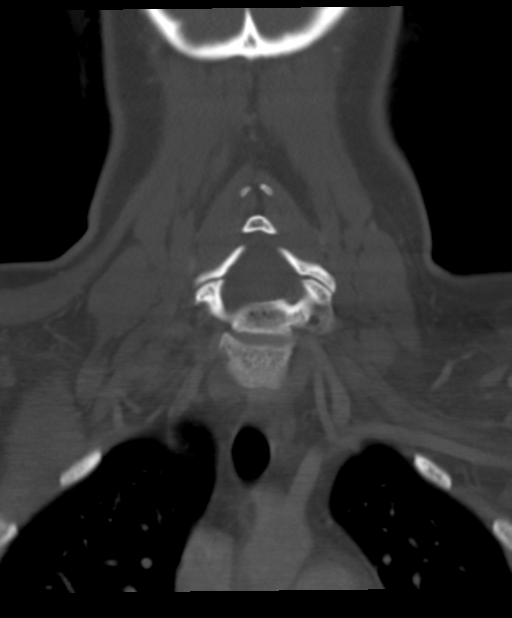
[im 84/140  bone]
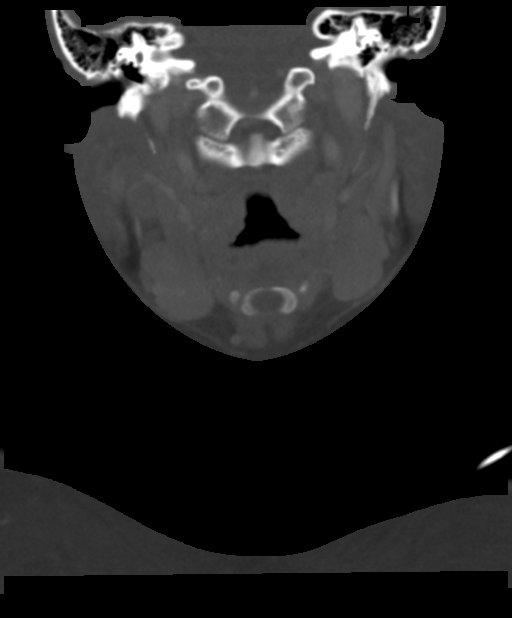

[Series 5: sag neck · sagittal · 0.42mm/px · 5 of 90 slices shown, 6 images]
[im 30/90  bone]
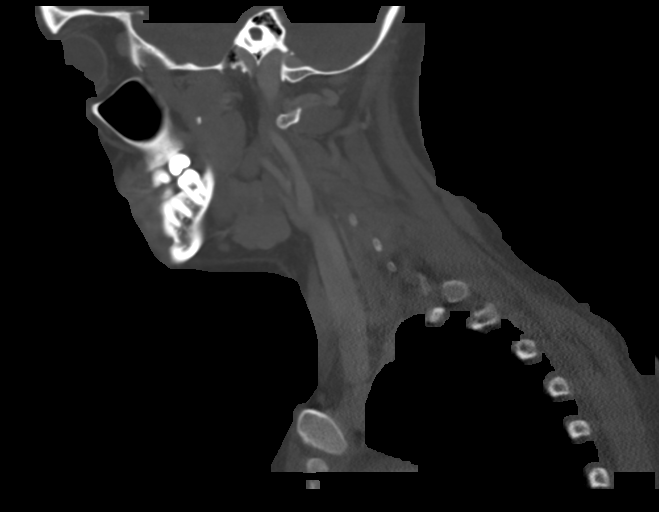
[im 38/90  bone]
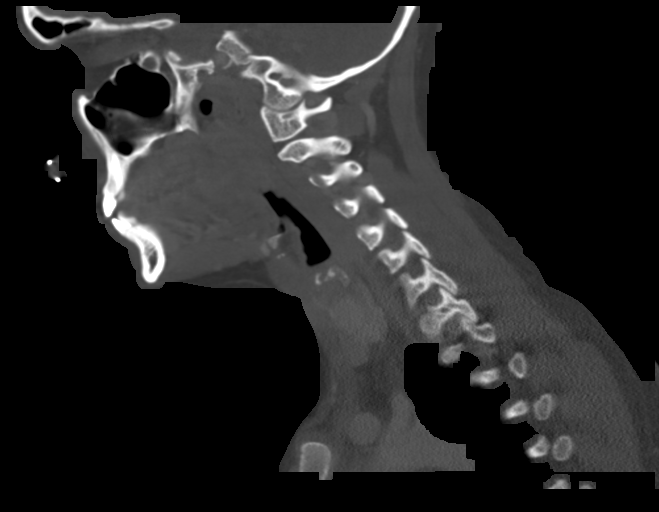
[im 45/90  soft-tissue]
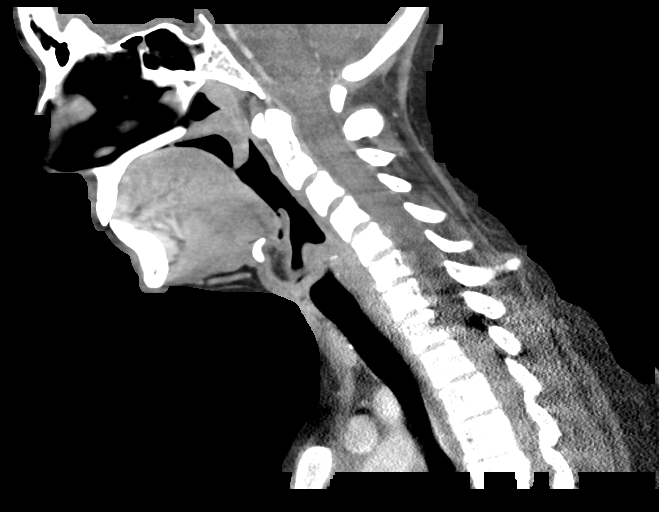
[im 45/90  bone]
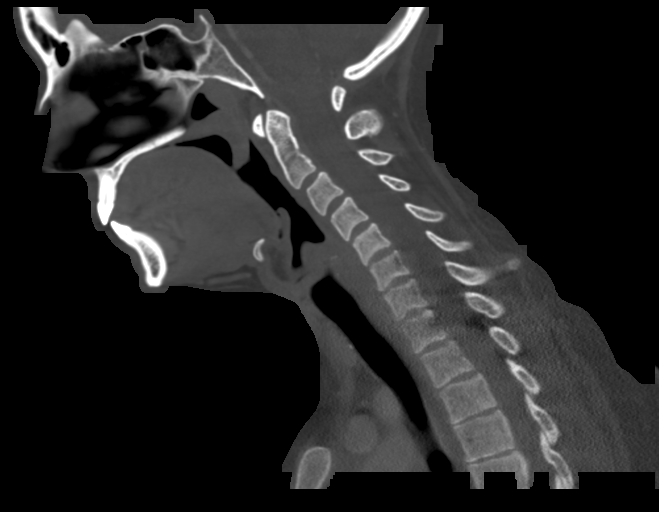
[im 52/90  bone]
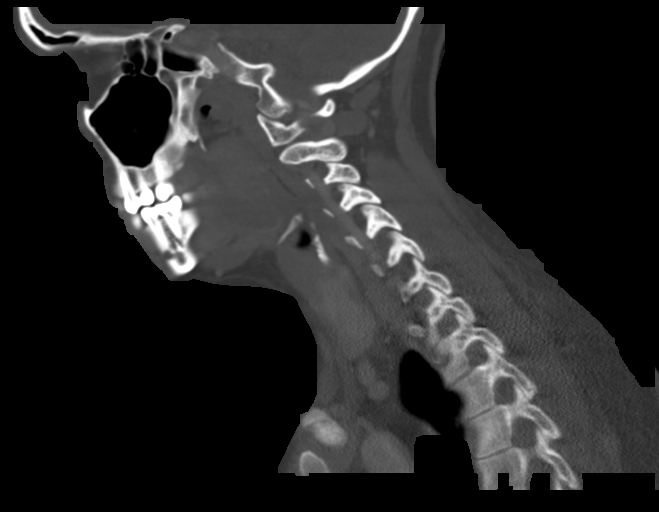
[im 60/90  bone]
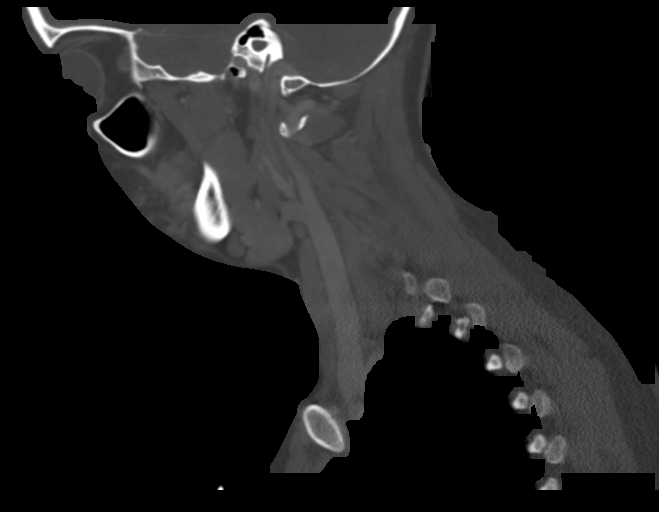

[Series 6: ax oropharynx · axial · 0.35mm/px · z∈[+842,+894]mm · 2 of 104 slices shown]
[im 26/104  bone]
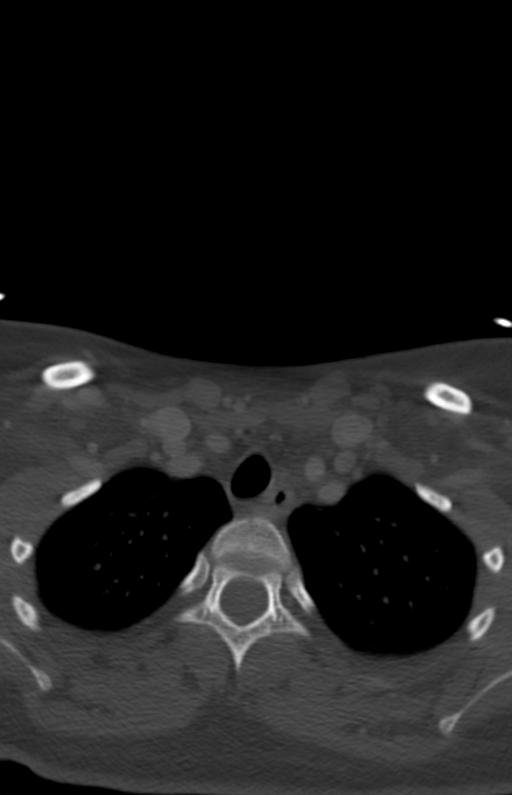
[im 52/104  bone]
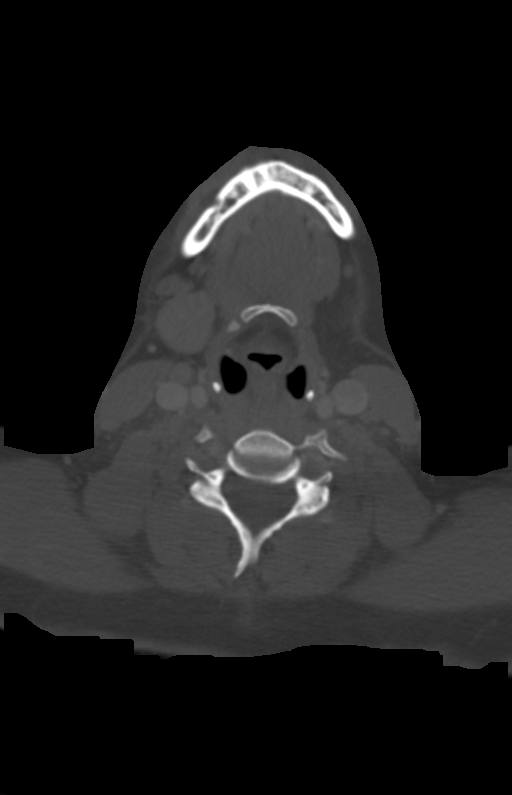

[13 of 33 positions shown; findings below may reference images not displayed]

RADIATION DOSE REDUCTION: This exam was performed according to the
departmental dose-optimization program which includes automated
exposure control, adjustment of the mA and/or kV according to
patient size and/or use of iterative reconstruction technique.

CONTRAST:  75mL OMNIPAQUE IOHEXOL 300 MG/ML  SOLN
FINDINGS: Pharynx and larynx: Symmetric tonsillar thickening. No laryngeal
edema or stenosis.

Salivary glands: No inflammation, mass, or stone.

Thyroid: Normal

Lymph nodes: Generous size of upper jugular chain lymph nodes with
homogeneous enhancement, a reactive pattern. No cavitation

Vascular: Unremarkable

Limited intracranial: Unremarkable

Visualized orbits: Unremarkable

Mastoids and visualized paranasal sinuses: Clear

Skeleton: Unremarkable

Upper chest: Clear apical lungs
IMPRESSION: Mild tonsillar thickening likely reflecting tonsillitis in this
setting. No abscess or epiglottitis.

## 2023-02-17 DIAGNOSIS — Z111 Encounter for screening for respiratory tuberculosis: Secondary | ICD-10-CM | POA: Diagnosis not present

## 2023-02-19 DIAGNOSIS — Z111 Encounter for screening for respiratory tuberculosis: Secondary | ICD-10-CM | POA: Diagnosis not present

## 2023-02-21 ENCOUNTER — Encounter: Payer: Self-pay | Admitting: Adult Health

## 2023-02-25 DIAGNOSIS — Z111 Encounter for screening for respiratory tuberculosis: Secondary | ICD-10-CM | POA: Diagnosis not present

## 2023-02-27 DIAGNOSIS — Z111 Encounter for screening for respiratory tuberculosis: Secondary | ICD-10-CM | POA: Diagnosis not present

## 2023-04-03 ENCOUNTER — Other Ambulatory Visit: Payer: Self-pay | Admitting: Adult Health

## 2023-04-03 ENCOUNTER — Other Ambulatory Visit (HOSPITAL_COMMUNITY): Payer: Self-pay

## 2023-04-03 DIAGNOSIS — F411 Generalized anxiety disorder: Secondary | ICD-10-CM

## 2023-04-03 DIAGNOSIS — F410 Panic disorder [episodic paroxysmal anxiety] without agoraphobia: Secondary | ICD-10-CM

## 2023-04-03 MED ORDER — ALPRAZOLAM 0.5 MG PO TABS
0.5000 mg | ORAL_TABLET | Freq: Three times a day (TID) | ORAL | 1 refills | Status: DC
Start: 2023-04-03 — End: 2023-06-15
  Filled 2023-04-03: qty 90, 30d supply, fill #0
  Filled 2023-05-07: qty 90, 30d supply, fill #1

## 2023-04-03 NOTE — Telephone Encounter (Signed)
Please schedule pt for an appt, she is due in Jan. LV 07/25

## 2023-04-03 NOTE — Telephone Encounter (Signed)
LF 11/1; LV 07/25 NV DUE IN JAN.

## 2023-04-11 DIAGNOSIS — Z6824 Body mass index (BMI) 24.0-24.9, adult: Secondary | ICD-10-CM | POA: Diagnosis not present

## 2023-04-11 DIAGNOSIS — Z309 Encounter for contraceptive management, unspecified: Secondary | ICD-10-CM | POA: Diagnosis not present

## 2023-04-11 DIAGNOSIS — Z01419 Encounter for gynecological examination (general) (routine) without abnormal findings: Secondary | ICD-10-CM | POA: Diagnosis not present

## 2023-04-11 DIAGNOSIS — Z3202 Encounter for pregnancy test, result negative: Secondary | ICD-10-CM | POA: Diagnosis not present

## 2023-04-11 DIAGNOSIS — Z113 Encounter for screening for infections with a predominantly sexual mode of transmission: Secondary | ICD-10-CM | POA: Diagnosis not present

## 2023-04-29 ENCOUNTER — Other Ambulatory Visit (HOSPITAL_COMMUNITY): Payer: Self-pay

## 2023-05-01 ENCOUNTER — Other Ambulatory Visit (HOSPITAL_COMMUNITY): Payer: Self-pay

## 2023-05-08 ENCOUNTER — Other Ambulatory Visit: Payer: Self-pay

## 2023-05-09 ENCOUNTER — Other Ambulatory Visit (HOSPITAL_COMMUNITY): Payer: Self-pay

## 2023-06-15 ENCOUNTER — Encounter: Payer: Self-pay | Admitting: Adult Health

## 2023-06-15 ENCOUNTER — Ambulatory Visit: Payer: Commercial Managed Care - PPO | Admitting: Adult Health

## 2023-06-15 ENCOUNTER — Other Ambulatory Visit (HOSPITAL_COMMUNITY): Payer: Self-pay

## 2023-06-15 DIAGNOSIS — F410 Panic disorder [episodic paroxysmal anxiety] without agoraphobia: Secondary | ICD-10-CM

## 2023-06-15 DIAGNOSIS — F325 Major depressive disorder, single episode, in full remission: Secondary | ICD-10-CM

## 2023-06-15 DIAGNOSIS — F411 Generalized anxiety disorder: Secondary | ICD-10-CM

## 2023-06-15 MED ORDER — ALPRAZOLAM 0.5 MG PO TABS
0.5000 mg | ORAL_TABLET | Freq: Three times a day (TID) | ORAL | 2 refills | Status: DC
Start: 2023-06-15 — End: 2023-10-06
  Filled 2023-06-15: qty 90, 30d supply, fill #0
  Filled 2023-07-30: qty 90, 30d supply, fill #1
  Filled 2023-09-01 – 2023-09-02 (×2): qty 90, 30d supply, fill #2

## 2023-06-15 MED ORDER — ESCITALOPRAM OXALATE 20 MG PO TABS
10.0000 mg | ORAL_TABLET | Freq: Every day | ORAL | 1 refills | Status: DC
Start: 2023-06-15 — End: 2023-10-30
  Filled 2023-06-15: qty 45, 90d supply, fill #0
  Filled 2023-09-01 – 2023-09-02 (×2): qty 45, 90d supply, fill #1
  Filled 2023-10-29: qty 45, 90d supply, fill #2
  Filled 2023-10-30: qty 15, 30d supply, fill #2

## 2023-06-15 NOTE — Progress Notes (Signed)
Krystal Peters [id number] Jun 16, 2000 23 y.o.  Subjective:   Patient ID:  Krystal Peters is a 23 y.o. (DOB 12-14-2000) female.  Chief Complaint: No chief complaint on file.   HPI MAKENZEY NANNI presents to the office today for follow-up of depression, anxiety and panic attacks.  Describes mood today as "ok". Pleasant. Tearful at times - when "overwhelmed". Mood symptoms - reports "some" depression and anxiety. Reports panic attacks - once or twice a week. Reports decreased interest and motivation. Reports irritability at times. Reports worry, rumination, and over thinking. Reports routines and rituals.  Mood is lower. Stating "I'm ok, but could be better - not as I have been". Taking medications as prescribed. Energy levels stable. Active, has a regular exercise routine.   Enjoys some usual interests and activities. Single - has a boyfriend of 7 months. Student. Living at home with mother. Father in [place]. Spending time with family. Appetite adequate. Weight gain - 165 to 170 pounds. Sleeping well most nights. Averages 8 to 9 hours. Denies daytime napping. Reports focus and concentration difficulties - "things seem harder for me". Completing tasks. Managing aspects of household. 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  Denies AH or VH. Denies self harm. Denies substance use.  Previous medication trials: Lexapro, Xanax, Zoloft   PHQ2-9    Flowsheet Row Office Visit from 01/10/2017 in [facility] Office Visit from 10/04/2016 in [facility] Office Visit from 09/15/2016 in [facility] Office Visit from 06/07/2016 in [facility] Office Visit from 12/30/2015 in [facility]  PHQ-2 Total Score 0 0 0 0 0  PHQ-9 Total Score -- -- -- 0 0      Flowsheet Row ED from 08/27/2022 in  [facility] Emergency Department at [facility] ED from 10/06/2021 in [facility] Emergency Department at [facility] Admission (Discharged) from 08/11/2020 in [facility]  C-SSRS RISK CATEGORY No Risk No Risk No Risk        Review of Systems:  Review of Systems  Musculoskeletal:  Negative for gait problem.  Neurological:  Negative for tremors.  Psychiatric/Behavioral:         Please refer to HPI    Medications: I have reviewed the patient's current medications.  Current Outpatient Medications  Medication Sig Dispense Refill   ALPRAZolam (XANAX) 0.5 MG tablet Take 1 tablet (0.5 mg total) by mouth 3 (three) times daily. 90 tablet 1   diltiazem 2 % GEL Using your index finger, apply a small amount of medication inside the rectum up to your first knuckle/joint 3 times daily x 6- 8 weeks. 30 g 0   escitalopram (LEXAPRO) 10 MG tablet Take 1 tablet (10 mg total) by mouth daily. 90 tablet 1   No current facility-administered medications for this visit.    Medication Side Effects: None  Allergies:  Allergies  Allergen Reactions   Oxycodone Hives and Rash   Shellfish Allergy Other (See Comments)    Unknown-not specifically determined Unknown-not specifically determined    Past Medical History:  Diagnosis Date   Anal fissure    Anxiety    Cystitis    Depression    History of UTI    PONV (postoperative nausea and vomiting)    Vitamin D deficiency     Past Medical History, Surgical history, Social history, and Family history were  reviewed and updated as appropriate.   Please see review of systems for further details on the patient's review from today.   Objective:   Physical Exam:  There were no vitals taken for this visit.  Physical Exam Constitutional:      General: She is not in acute distress. Musculoskeletal:        General: No deformity.  Neurological:     Mental Status: She is alert and oriented to person, place, and time.     Coordination:  Coordination normal.  Psychiatric:        Attention and Perception: Attention and perception normal. She does not perceive auditory or visual hallucinations.        Mood and Affect: Affect is not labile, blunt, angry or inappropriate.        Speech: Speech normal.        Behavior: Behavior normal.        Thought Content: Thought content normal. Thought content is not paranoid or delusional. Thought content does not include homicidal or suicidal ideation. Thought content does not include homicidal or suicidal plan.        Cognition and Memory: Cognition and memory normal.        Judgment: Judgment normal.     Comments: Insight intact     Lab Review:     Component Value Date/Time   NA 139 08/27/2022 1624   NA 140 01/10/2017 1819   K 4.2 08/27/2022 1624   CL 106 08/27/2022 1624   CO2 25 08/27/2022 1624   GLUCOSE 74 08/27/2022 1624   BUN 11 08/27/2022 1624   BUN 5 01/10/2017 1819   CREATININE 0.80 08/27/2022 1624   CALCIUM 9.7 08/27/2022 1624   PROT 6.8 07/01/2022 1119   PROT 7.0 01/10/2017 1819   ALBUMIN 3.9 07/01/2022 1119   ALBUMIN 4.3 01/10/2017 1819   AST 12 07/01/2022 1119   ALT 8 07/01/2022 1119   ALKPHOS 45 07/01/2022 1119   BILITOT 0.4 07/01/2022 1119   BILITOT <0.2 01/10/2017 1819   GFRNONAA >60 08/27/2022 1624   GFRAA >60 07/14/2019 1920       Component Value Date/Time   WBC 12.9 (H) 08/27/2022 1624   RBC 4.33 08/27/2022 1624   HGB 12.8 08/27/2022 1624   HGB 12.9 01/10/2017 1819   HCT 38.6 08/27/2022 1624   HCT 36.9 01/10/2017 1819   PLT 403 (H) 08/27/2022 1624   PLT 161 01/10/2017 1819   MCV 89.1 08/27/2022 1624   MCV 84 01/10/2017 1819   MCH 29.6 08/27/2022 1624   MCHC 33.2 08/27/2022 1624   RDW 12.7 08/27/2022 1624   RDW 13.7 01/10/2017 1819   LYMPHSABS 2.7 07/01/2022 1119   LYMPHSABS 1.2 01/10/2017 1819   MONOABS 0.5 07/01/2022 1119   EOSABS 0.2 07/01/2022 1119   EOSABS 0.1 01/10/2017 1819   BASOSABS 0.1 07/01/2022 1119   BASOSABS 0.0 01/10/2017  1819    No results found for: "POCLITH", "LITHIUM"   No results found for: "PHENYTOIN", "PHENOBARB", "VALPROATE", "CBMZ"   .res Assessment: Plan:    Plan:  Xanax 0.5mg  TID prn anxiety   Increase Lexapro 10mg  to 20mg  daily.  Consider NAC for OCD thoughts  RTC 6 months   Patient advised to contact office with any questions, adverse effects, or acute worsening in signs and symptoms.  Discussed potential benefits, risk, and side effects of benzodiazepines to include potential risk of tolerance and dependence, as well as possible drowsiness. Advised patient not to drive if experiencing drowsiness and  to take lowest possible effective dose to minimize risk of dependence and tolerance.  There are no diagnoses linked to this encounter.   Please see After Visit Summary for patient specific instructions.  Future Appointments  Date Time Provider Department Center  06/15/2023  2:30 PM Archie Atilano, Thereasa Solo, NP [facility] None    No orders of the defined types were placed in this encounter.   -------------------------------

## 2023-07-31 ENCOUNTER — Other Ambulatory Visit: Payer: Self-pay

## 2023-07-31 ENCOUNTER — Other Ambulatory Visit (HOSPITAL_COMMUNITY): Payer: Self-pay

## 2023-08-01 ENCOUNTER — Ambulatory Visit: Payer: Commercial Managed Care - PPO | Admitting: Physician Assistant

## 2023-09-01 ENCOUNTER — Other Ambulatory Visit (HOSPITAL_COMMUNITY): Payer: Self-pay

## 2023-09-02 ENCOUNTER — Other Ambulatory Visit (HOSPITAL_COMMUNITY): Payer: Self-pay

## 2023-10-06 ENCOUNTER — Other Ambulatory Visit: Payer: Self-pay | Admitting: Adult Health

## 2023-10-06 DIAGNOSIS — F410 Panic disorder [episodic paroxysmal anxiety] without agoraphobia: Secondary | ICD-10-CM

## 2023-10-06 DIAGNOSIS — F411 Generalized anxiety disorder: Secondary | ICD-10-CM

## 2023-10-09 ENCOUNTER — Other Ambulatory Visit (HOSPITAL_COMMUNITY): Payer: Self-pay

## 2023-10-09 MED ORDER — ALPRAZOLAM 0.5 MG PO TABS
0.5000 mg | ORAL_TABLET | Freq: Three times a day (TID) | ORAL | 2 refills | Status: DC
Start: 2023-10-09 — End: 2023-12-18
  Filled 2023-10-09: qty 90, 30d supply, fill #0
  Filled 2023-11-09: qty 90, 30d supply, fill #1
  Filled 2023-12-11: qty 90, 30d supply, fill #2

## 2023-10-19 ENCOUNTER — Institutional Professional Consult (permissible substitution): Admitting: Family Medicine

## 2023-10-30 ENCOUNTER — Telehealth: Payer: Self-pay | Admitting: Adult Health

## 2023-10-30 ENCOUNTER — Other Ambulatory Visit (HOSPITAL_COMMUNITY): Payer: Self-pay

## 2023-10-30 ENCOUNTER — Other Ambulatory Visit: Payer: Self-pay | Admitting: Adult Health

## 2023-10-30 DIAGNOSIS — F325 Major depressive disorder, single episode, in full remission: Secondary | ICD-10-CM

## 2023-10-30 MED ORDER — ESCITALOPRAM OXALATE 20 MG PO TABS
20.0000 mg | ORAL_TABLET | Freq: Every day | ORAL | 0 refills | Status: DC
Start: 2023-10-30 — End: 2023-12-18
  Filled 2023-11-09 – 2023-11-10 (×3): qty 90, 90d supply, fill #0

## 2023-10-30 NOTE — Telephone Encounter (Signed)
 Addressed via pharmacy interface

## 2023-10-30 NOTE — Telephone Encounter (Signed)
 Pt called and said at her last visit she was told to increase her lexapro  to 20 mg. She needs a new script that says that. Pharmacy is [facility]

## 2023-10-31 ENCOUNTER — Other Ambulatory Visit (HOSPITAL_COMMUNITY): Payer: Self-pay

## 2023-11-09 ENCOUNTER — Other Ambulatory Visit: Payer: Self-pay

## 2023-11-09 ENCOUNTER — Other Ambulatory Visit (HOSPITAL_COMMUNITY): Payer: Self-pay

## 2023-11-10 ENCOUNTER — Other Ambulatory Visit: Payer: Self-pay

## 2023-11-10 ENCOUNTER — Other Ambulatory Visit (HOSPITAL_COMMUNITY): Payer: Self-pay

## 2023-12-11 ENCOUNTER — Other Ambulatory Visit (HOSPITAL_COMMUNITY): Payer: Self-pay

## 2023-12-11 ENCOUNTER — Other Ambulatory Visit: Payer: Self-pay | Admitting: Adult Health

## 2023-12-11 DIAGNOSIS — F325 Major depressive disorder, single episode, in full remission: Secondary | ICD-10-CM

## 2023-12-12 ENCOUNTER — Other Ambulatory Visit (HOSPITAL_COMMUNITY): Payer: Self-pay

## 2023-12-14 ENCOUNTER — Other Ambulatory Visit (HOSPITAL_COMMUNITY): Payer: Self-pay

## 2023-12-18 ENCOUNTER — Encounter: Payer: Self-pay | Admitting: Adult Health

## 2023-12-18 ENCOUNTER — Ambulatory Visit: Payer: Commercial Managed Care - PPO | Admitting: Adult Health

## 2023-12-18 ENCOUNTER — Other Ambulatory Visit (HOSPITAL_BASED_OUTPATIENT_CLINIC_OR_DEPARTMENT_OTHER): Payer: Self-pay

## 2023-12-18 DIAGNOSIS — F325 Major depressive disorder, single episode, in full remission: Secondary | ICD-10-CM

## 2023-12-18 DIAGNOSIS — F410 Panic disorder [episodic paroxysmal anxiety] without agoraphobia: Secondary | ICD-10-CM | POA: Diagnosis not present

## 2023-12-18 DIAGNOSIS — F411 Generalized anxiety disorder: Secondary | ICD-10-CM

## 2023-12-18 MED ORDER — ESCITALOPRAM OXALATE 20 MG PO TABS
20.0000 mg | ORAL_TABLET | Freq: Every day | ORAL | 1 refills | Status: AC
  Filled 2023-12-18: qty 90, 90d supply, fill #0
  Filled 2024-05-02: qty 90, 90d supply, fill #1

## 2023-12-18 MED ORDER — ALPRAZOLAM 0.5 MG PO TABS
0.5000 mg | ORAL_TABLET | Freq: Three times a day (TID) | ORAL | 2 refills | Status: DC
  Filled 2023-12-18 – 2024-01-09 (×3): qty 90, 30d supply, fill #0
  Filled 2024-02-12: qty 90, 30d supply, fill #1
  Filled 2024-03-21: qty 90, 30d supply, fill #2

## 2023-12-18 NOTE — Progress Notes (Signed)
 Krystal Peters [id number] 2001/04/11 23 y.o.  Subjective:   Patient ID:  Krystal Peters is a 23 y.o. (DOB 02/14/2001) female.  Chief Complaint: No chief complaint on file.   HPI Krystal Peters presents to the office today for follow-up of depression, anxiety and panic attacks.  Describes mood today as ok. Pleasant. Tearful at times - hormonal. Mood symptoms - denies depression, irritability and anxiety. Reports stable interest and motivation. Reports panic attacks - occasionally. Reports worry, rumination, and over thinking. Denies routines and rituals. Reports mood is stable. Stating I'm feeling pretty good. Taking medications as prescribed. Energy levels stable. Active, has a regular exercise routine.   Enjoys some usual interests and activities. Engaged. Lives with fiance. Mother in [place]. Father in [place]. Spending time with family. Appetite adequate. Weight stable - 170 pounds. Sleeping well most nights. Averages 8 to 9 hours. Denies daytime napping. Reports focus and concentration stable. Completing tasks. Managing aspects of household. Works as a Buyer, retail. 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  Denies AH or VH. Denies self harm. Denies substance use.  Previous medication trials: Lexapro , Xanax , Zoloft     PHQ2-9    Flowsheet Row Office Visit from 01/10/2017 in [facility] Office Visit from 10/04/2016 in [facility] Office Visit from 09/15/2016 in [facility] Office Visit from 06/07/2016 in [facility] Office Visit from 12/30/2015 in [facility]  PHQ-2 Total Score 0 0 0 0 0  PHQ-9 Total Score -- -- -- 0 0   Flowsheet Row ED from 08/27/2022 in [facility] Emergency Department at [facility] ED from 10/06/2021 in [facility] Emergency Department at [facility] Admission (Discharged) from 08/11/2020  in [facility]  C-SSRS RISK CATEGORY No Risk No Risk No Risk     Review of Systems:  Review of Systems  Musculoskeletal:  Negative for gait problem.  Neurological:  Negative for tremors.  Psychiatric/Behavioral:         Please refer to HPI    Medications: I have reviewed the patient's current medications.  Current Outpatient Medications  Medication Sig Dispense Refill   ALPRAZolam  (XANAX ) 0.5 MG tablet Take 1 tablet (0.5 mg total) by mouth 3 (three) times daily. 90 tablet 2   diltiazem  2 % GEL Using your index finger, apply a small amount of medication inside the rectum up to your first knuckle/joint 3 times daily x 6- 8 weeks. 30 g 0   escitalopram  (LEXAPRO ) 20 MG tablet Take 1 tablet (20 mg total) by mouth daily. 90 tablet 1   No current facility-administered medications for this visit.    Medication Side Effects: None  Allergies:  Allergies  Allergen Reactions   Oxycodone  Hives and Rash   Shellfish Allergy Other (See Comments)    Unknown-not specifically determined Unknown-not specifically determined    Past Medical History:  Diagnosis Date   Anal fissure    Anxiety    Cystitis    Depression    History of UTI    PONV (postoperative nausea and vomiting)    Vitamin D deficiency     Past Medical History, Surgical history, Social history, and Family history were reviewed and updated as appropriate.   Please see review of systems for further details on the patient's review from today.   Objective:   Physical Exam:  There were no vitals taken for this visit.  Physical Exam Constitutional:  General: She is not in acute distress. Musculoskeletal:        General: No deformity.  Neurological:     Mental Status: She is alert and oriented to person, place, and time.     Coordination: Coordination normal.  Psychiatric:        Attention and Perception: Attention and perception normal. She does not perceive auditory or visual hallucinations.        Mood and  Affect: Mood normal. Mood is not anxious or depressed. Affect is not labile, blunt, angry or inappropriate.        Speech: Speech normal.        Behavior: Behavior normal.        Thought Content: Thought content normal. Thought content is not paranoid or delusional. Thought content does not include homicidal or suicidal ideation. Thought content does not include homicidal or suicidal plan.        Cognition and Memory: Cognition and memory normal.        Judgment: Judgment normal.     Comments: Insight intact     Lab Review:     Component Value Date/Time   NA 139 08/27/2022 1624   NA 140 01/10/2017 1819   K 4.2 08/27/2022 1624   CL 106 08/27/2022 1624   CO2 25 08/27/2022 1624   GLUCOSE 74 08/27/2022 1624   BUN 11 08/27/2022 1624   BUN 5 01/10/2017 1819   CREATININE 0.80 08/27/2022 1624   CALCIUM 9.7 08/27/2022 1624   PROT 6.8 07/01/2022 1119   PROT 7.0 01/10/2017 1819   ALBUMIN 3.9 07/01/2022 1119   ALBUMIN 4.3 01/10/2017 1819   AST 12 07/01/2022 1119   ALT 8 07/01/2022 1119   ALKPHOS 45 07/01/2022 1119   BILITOT 0.4 07/01/2022 1119   BILITOT <0.2 01/10/2017 1819   GFRNONAA >60 08/27/2022 1624   GFRAA >60 07/14/2019 1920       Component Value Date/Time   WBC 12.9 (H) 08/27/2022 1624   RBC 4.33 08/27/2022 1624   HGB 12.8 08/27/2022 1624   HGB 12.9 01/10/2017 1819   HCT 38.6 08/27/2022 1624   HCT 36.9 01/10/2017 1819   PLT 403 (H) 08/27/2022 1624   PLT 161 01/10/2017 1819   MCV 89.1 08/27/2022 1624   MCV 84 01/10/2017 1819   MCH 29.6 08/27/2022 1624   MCHC 33.2 08/27/2022 1624   RDW 12.7 08/27/2022 1624   RDW 13.7 01/10/2017 1819   LYMPHSABS 2.7 07/01/2022 1119   LYMPHSABS 1.2 01/10/2017 1819   MONOABS 0.5 07/01/2022 1119   EOSABS 0.2 07/01/2022 1119   EOSABS 0.1 01/10/2017 1819   BASOSABS 0.1 07/01/2022 1119   BASOSABS 0.0 01/10/2017 1819    No results found for: POCLITH, LITHIUM   No results found for: PHENYTOIN, PHENOBARB, VALPROATE, CBMZ    .res Assessment: Plan:    Plan:  Xanax  0.5mg  TID prn anxiety   Lexapro  20mg  daily.  Consider NAC for OCD thoughts  RTC 6 months   20 minutes spent dedicated to the care of this patient on the date of this encounter to include pre-visit review of records, ordering of medication, post visit documentation, and face-to-face time with the patient discussing depression, anxiety, and insomnia. Discussed continuing current medication regimen.  Patient advised to contact office with any questions, adverse effects, or acute worsening in signs and symptoms.  Discussed potential benefits, risk, and side effects of benzodiazepines to include potential risk of tolerance and dependence, as well as possible drowsiness. Advised patient not to drive  if experiencing drowsiness and to take lowest possible effective dose to minimize risk of dependence and tolerance.  Diagnoses and all orders for this visit:  Major depression, single episode, in complete remission (HCC) -     escitalopram  (LEXAPRO ) 20 MG tablet; Take 1 tablet (20 mg total) by mouth daily.  Generalized anxiety disorder -     ALPRAZolam  (XANAX ) 0.5 MG tablet; Take 1 tablet (0.5 mg total) by mouth 3 (three) times daily.  Panic attacks -     ALPRAZolam  (XANAX ) 0.5 MG tablet; Take 1 tablet (0.5 mg total) by mouth 3 (three) times daily.     Please see After Visit Summary for patient specific instructions.  No future appointments.   No orders of the defined types were placed in this encounter.   -------------------------------

## 2024-01-04 ENCOUNTER — Other Ambulatory Visit (HOSPITAL_BASED_OUTPATIENT_CLINIC_OR_DEPARTMENT_OTHER): Payer: Self-pay

## 2024-01-09 ENCOUNTER — Other Ambulatory Visit (HOSPITAL_BASED_OUTPATIENT_CLINIC_OR_DEPARTMENT_OTHER): Payer: Self-pay

## 2024-01-09 ENCOUNTER — Other Ambulatory Visit (HOSPITAL_COMMUNITY): Payer: Self-pay

## 2024-02-13 ENCOUNTER — Other Ambulatory Visit: Payer: Self-pay

## 2024-02-14 ENCOUNTER — Other Ambulatory Visit: Payer: Self-pay | Admitting: Medical Genetics

## 2024-02-14 DIAGNOSIS — Z006 Encounter for examination for normal comparison and control in clinical research program: Secondary | ICD-10-CM

## 2024-03-05 LAB — GENECONNECT MOLECULAR SCREEN: Genetic Analysis Overall Interpretation: NEGATIVE

## 2024-03-21 ENCOUNTER — Other Ambulatory Visit: Payer: Self-pay

## 2024-05-06 ENCOUNTER — Other Ambulatory Visit: Payer: Self-pay

## 2024-05-06 ENCOUNTER — Telehealth: Payer: Self-pay | Admitting: Adult Health

## 2024-05-06 ENCOUNTER — Other Ambulatory Visit: Payer: Self-pay | Admitting: Adult Health

## 2024-05-06 ENCOUNTER — Other Ambulatory Visit (HOSPITAL_BASED_OUTPATIENT_CLINIC_OR_DEPARTMENT_OTHER): Payer: Self-pay

## 2024-05-06 DIAGNOSIS — F411 Generalized anxiety disorder: Secondary | ICD-10-CM

## 2024-05-06 DIAGNOSIS — F410 Panic disorder [episodic paroxysmal anxiety] without agoraphobia: Secondary | ICD-10-CM

## 2024-05-06 MED ORDER — ALPRAZOLAM 0.5 MG PO TABS
0.5000 mg | ORAL_TABLET | Freq: Three times a day (TID) | ORAL | 1 refills | Status: AC
  Filled 2024-05-06: qty 90, 30d supply, fill #0

## 2024-05-06 NOTE — Telephone Encounter (Signed)
 Patient lvm at 11:24 requesting refill for Alprazolam  0.5mg . Ph: [phone] Appt 2/16 Pharmacy [facility] [address] [place]

## 2024-05-06 NOTE — Telephone Encounter (Signed)
"  Pended   "

## 2024-05-12 ENCOUNTER — Other Ambulatory Visit (HOSPITAL_COMMUNITY): Payer: Self-pay

## 2024-05-12 ENCOUNTER — Telehealth: Admitting: Family

## 2024-05-12 DIAGNOSIS — R399 Unspecified symptoms and signs involving the genitourinary system: Secondary | ICD-10-CM | POA: Diagnosis not present

## 2024-05-12 MED ORDER — CEPHALEXIN 500 MG PO CAPS
500.0000 mg | ORAL_CAPSULE | Freq: Two times a day (BID) | ORAL | 0 refills | Status: AC
  Filled 2024-05-12: qty 14, 7d supply, fill #0

## 2024-05-12 NOTE — Progress Notes (Signed)

## 2024-05-13 ENCOUNTER — Telehealth

## 2024-05-13 DIAGNOSIS — L719 Rosacea, unspecified: Secondary | ICD-10-CM

## 2024-05-14 ENCOUNTER — Other Ambulatory Visit (HOSPITAL_COMMUNITY): Payer: Self-pay

## 2024-05-14 MED ORDER — METRONIDAZOLE 1 % EX GEL
Freq: Every day | CUTANEOUS | 0 refills | Status: AC
  Filled 2024-05-14: qty 60, 30d supply, fill #0

## 2024-05-14 NOTE — Progress Notes (Signed)
 E visit for Rosacea We are sorry that you are not feeling well. Here is how we plan to help! Based on what you shared with me it looks like you have Rosacea.  Rosacea is a common chronic skin condition that usually only affects the face and eyes.  Occasionally, the neck, chest, or other areas may be involved.  Characterized by redness, pimples, and broken blood vessels, rosacea tends to begin after middle age (between the ages of 61 and 3).  It is more common in fair-skinned people and women in menopause. It may appear differently in dark skinned people but rosacea effects all ethnic groups.  The cause of rosacea is not fully understood. We do know that rosacea is worsened by various trigger factors including, spicy or hot foods, hot beverages such as coffee or tea, alcohol, and sun exposure just to name a few.  Signs of rosacea may vary greatly from person to person. In some individuals it may only flare up from time to time.   I have prescribed: A topical antibiotic called Metronidazole  1%.  Apply a thin film to affected area once daily.  Wash your hands before and after use. Make sure your skin is clean and dry.  Rub in gently and completely over the affected areas.   HOME CARE: Keep a record of triggers, such as stress, weather, or certain foods or drinks. Consider limiting hot or spicy foods and alcohol. Always use sunscreen that protects against UVA and UVB rays and has a sun-protecting factor (SPF) of 15 or higher. Avoid putting steroids on the skin sores. Steroids may make rosacea worse.  You may use small amounts of water based cosmetic while using this medication.  Apply cosmetics after cream has dried. If you shave your face, use an electric razor Dont scrub your skin or use sponges, brushes, or other abrasive tools. Doing so can irritate your skin. GET HELP RIGHT AWAY IF: If your rosacea gets worse or is not better within 4 weeks. If a new skin condition or rash develops. Loss of  feeling or tingling of treated area Nausea  MAKE SURE YOU   Understand these instructions. Will watch your condition. Will get help right away if you are not doing well or get worse.  Your e-visit answers were reviewed by a board certified advanced clinical practitioner to complete your personal care plan. Depending upon the condition, your plan could have included both over the counter or prescription medications. Please review your pharmacy choice. Be sure that the pharmacy you have chosen is open so that you can pick up your prescription now.  If there is a problem you may message your provider in MyChart to have the prescription routed to another pharmacy. Your safety is important to us . If you have drug allergies check your prescription carefully.  For the next 24 hours, you can use MyChart to ask questions about todays visit, request a non-urgent call back, or ask for a work or school excuse from your e-visit provider. You will get an email in the next two days asking about your experience. I hope that your e-visit has been valuable and will speed your recovery.  I have spent 5 minutes in review of e-visit questionnaire, review and updating patient chart, medical decision making and response to patient.   Elsie Velma Lunger, PA-C

## 2024-06-17 ENCOUNTER — Ambulatory Visit: Admitting: Adult Health
